# Patient Record
Sex: Female | Born: 1950 | Race: White | Hispanic: No | Smoking: Former smoker
Health system: Southern US, Community
[De-identification: ages and names within clinical notes are randomized; demographics above are authoritative.]

## PROBLEM LIST (undated history)

## (undated) DIAGNOSIS — R1310 Dysphagia, unspecified: Secondary | ICD-10-CM

## (undated) DIAGNOSIS — G8929 Other chronic pain: Secondary | ICD-10-CM

## (undated) DIAGNOSIS — J189 Pneumonia, unspecified organism: Secondary | ICD-10-CM

## (undated) DIAGNOSIS — M1990 Unspecified osteoarthritis, unspecified site: Secondary | ICD-10-CM

## (undated) DIAGNOSIS — F32A Depression, unspecified: Secondary | ICD-10-CM

## (undated) DIAGNOSIS — E119 Type 2 diabetes mellitus without complications: Secondary | ICD-10-CM

## (undated) DIAGNOSIS — K589 Irritable bowel syndrome without diarrhea: Secondary | ICD-10-CM

## (undated) DIAGNOSIS — T8859XA Other complications of anesthesia, initial encounter: Secondary | ICD-10-CM

## (undated) DIAGNOSIS — G4730 Sleep apnea, unspecified: Secondary | ICD-10-CM

## (undated) DIAGNOSIS — K760 Fatty (change of) liver, not elsewhere classified: Secondary | ICD-10-CM

## (undated) DIAGNOSIS — K219 Gastro-esophageal reflux disease without esophagitis: Secondary | ICD-10-CM

## (undated) DIAGNOSIS — E039 Hypothyroidism, unspecified: Secondary | ICD-10-CM

## (undated) DIAGNOSIS — I10 Essential (primary) hypertension: Secondary | ICD-10-CM

## (undated) DIAGNOSIS — F419 Anxiety disorder, unspecified: Secondary | ICD-10-CM

## (undated) DIAGNOSIS — E7800 Pure hypercholesterolemia, unspecified: Secondary | ICD-10-CM

## (undated) HISTORY — DX: Essential (primary) hypertension: I10

## (undated) HISTORY — PX: OTHER SURGICAL HISTORY: SHX169

## (undated) HISTORY — PX: PARTIAL HYSTERECTOMY: SHX80

## (undated) HISTORY — DX: Gastro-esophageal reflux disease without esophagitis: K21.9

## (undated) HISTORY — DX: Pure hypercholesterolemia, unspecified: E78.00

## (undated) HISTORY — DX: Type 2 diabetes mellitus without complications: E11.9

## (undated) HISTORY — PX: CHOLECYSTECTOMY: SHX55

## (undated) HISTORY — DX: Hypothyroidism, unspecified: E03.9

## (undated) HISTORY — DX: Dysphagia, unspecified: R13.10

---

## 2001-07-15 ENCOUNTER — Ambulatory Visit (HOSPITAL_COMMUNITY): Admission: RE | Admit: 2001-07-15 | Discharge: 2001-07-15 | Payer: Self-pay | Admitting: Internal Medicine

## 2003-03-15 ENCOUNTER — Ambulatory Visit (HOSPITAL_COMMUNITY): Admission: RE | Admit: 2003-03-15 | Discharge: 2003-03-15 | Payer: Self-pay | Admitting: Internal Medicine

## 2003-03-15 ENCOUNTER — Encounter (INDEPENDENT_AMBULATORY_CARE_PROVIDER_SITE_OTHER): Payer: Self-pay | Admitting: Internal Medicine

## 2003-12-11 ENCOUNTER — Ambulatory Visit (HOSPITAL_COMMUNITY): Admission: RE | Admit: 2003-12-11 | Discharge: 2003-12-11 | Payer: Self-pay | Admitting: Family Medicine

## 2004-05-27 ENCOUNTER — Ambulatory Visit: Payer: Self-pay | Admitting: Urgent Care

## 2007-03-21 ENCOUNTER — Ambulatory Visit (HOSPITAL_COMMUNITY): Admission: RE | Admit: 2007-03-21 | Discharge: 2007-03-21 | Payer: Self-pay | Admitting: *Deleted

## 2007-03-23 ENCOUNTER — Ambulatory Visit (HOSPITAL_COMMUNITY): Admission: RE | Admit: 2007-03-23 | Discharge: 2007-03-23 | Payer: Self-pay | Admitting: *Deleted

## 2007-03-25 ENCOUNTER — Encounter (INDEPENDENT_AMBULATORY_CARE_PROVIDER_SITE_OTHER): Payer: Self-pay | Admitting: Interventional Radiology

## 2007-03-25 ENCOUNTER — Ambulatory Visit (HOSPITAL_COMMUNITY): Admission: RE | Admit: 2007-03-25 | Discharge: 2007-03-25 | Payer: Self-pay | Admitting: Interventional Radiology

## 2007-04-01 ENCOUNTER — Ambulatory Visit (HOSPITAL_COMMUNITY): Admission: RE | Admit: 2007-04-01 | Discharge: 2007-04-01 | Payer: Self-pay | Admitting: Interventional Radiology

## 2007-04-12 ENCOUNTER — Encounter: Admission: RE | Admit: 2007-04-12 | Discharge: 2007-04-12 | Payer: Self-pay | Admitting: Interventional Radiology

## 2007-05-02 ENCOUNTER — Encounter: Admission: RE | Admit: 2007-05-02 | Discharge: 2007-05-02 | Payer: Self-pay | Admitting: Interventional Radiology

## 2010-07-06 ENCOUNTER — Encounter: Payer: Self-pay | Admitting: Interventional Radiology

## 2010-07-06 ENCOUNTER — Encounter: Payer: Self-pay | Admitting: Family Medicine

## 2010-07-10 ENCOUNTER — Encounter
Admission: RE | Admit: 2010-07-10 | Discharge: 2010-07-10 | Payer: Self-pay | Source: Home / Self Care | Attending: Neurosurgery | Admitting: Neurosurgery

## 2010-08-21 ENCOUNTER — Other Ambulatory Visit: Payer: Self-pay | Admitting: Neurosurgery

## 2010-08-21 DIAGNOSIS — M47816 Spondylosis without myelopathy or radiculopathy, lumbar region: Secondary | ICD-10-CM

## 2010-08-26 ENCOUNTER — Ambulatory Visit
Admission: RE | Admit: 2010-08-26 | Discharge: 2010-08-26 | Disposition: A | Discharge status: Home / Self Care | Payer: 59 | Source: Ambulatory Visit | Attending: Neurosurgery | Admitting: Neurosurgery

## 2010-08-26 ENCOUNTER — Other Ambulatory Visit: Payer: Self-pay | Admitting: Neurosurgery

## 2010-08-26 DIAGNOSIS — M47816 Spondylosis without myelopathy or radiculopathy, lumbar region: Secondary | ICD-10-CM

## 2010-10-28 ENCOUNTER — Ambulatory Visit (INDEPENDENT_AMBULATORY_CARE_PROVIDER_SITE_OTHER): Payer: 59 | Admitting: Internal Medicine

## 2010-10-28 DIAGNOSIS — K219 Gastro-esophageal reflux disease without esophagitis: Secondary | ICD-10-CM

## 2010-10-28 DIAGNOSIS — R1310 Dysphagia, unspecified: Secondary | ICD-10-CM

## 2010-10-31 NOTE — Op Note (Signed)
[facility]  Patient:    Erika Ward, Erika Ward Visit Number: [id number] MRN: [id number]          Service Type: END Location: DAY Attending Physician:  Malissa Hippo Dictated by:   Tana Coast, P.A. Proc. Date: 07/15/01 Admit Date:  07/15/2001 Discharge Date: 07/15/2001   CC:         Dr. Marta Antu   Operative Report  DATE OF BIRTH:  22-Aug-1950.  PROCEDURE: PROCEDURE:  Esophageal manometry.  REFERRING PHYSICIAN:  Dr. Marta Antu.  INDICATIONS:  A 60 year old Caucasian female with refractory gastroesophageal reflux symptoms.  On recent EGD she had a large sliding hiatal hernia with some inflammation at the level of the diaphragmatic hiatus felt to be due to gastroesophageal reflux disease.  As she had failed medical therapy, it was felt that she should consider laparoscopic Nissen fundoplication procedure. PRIOR STUDIES:  EGD on 07/14/01 by Dr. Karilyn Cota revealed findings mentioned above.  METHOD:  The esophageal manometry study was performed with [employer] Gastrosoft Upper Gastrointestinal Polygram, version 6.40, using an Arndorfer infusion system at 0.5 cc/min. The lower esophageal sphincter (LES) was identified in four ports with the standard station pullthrough technique. For esophageal body pressures, the tip of the catheter was placed 3 cm above the proximal aspect of the LES.  Ten wet swallows were taken with ports 5 cm apart in the esophageal body.  This procedure was reviewed with the patient prior to performing it.  FINDINGS:  Esophageal body:  13 cm     8 cm      3 cm       Mean (distal 2 ports) Amplitude (mmHg)  86.0      96.1      67.3       81.7                   (38-102)  (49-131)  (64-154)   (59-139)  Duration (sec)    4.2       7.6       8.6        8.1                   (3-5)     (3-5)     (2.5-3.5)   (3.5)  Contractions (with wet swallows):  10 of 11 were peristaltic with one simultaneous.  Lower esophageal sphincter  (LES):  Located at 35-31 cm (from nares). Resting pressure: 10.0 mmHg. Relaxation:  Difficult to assess based on tracing.  IMPRESSION:  Nonspecific motility disorder with abnormal body and low resting LES pressure.  Upper leads were located in the upper esophageal sphincter making tracing poor tracing but there was good peristalsis.  There was high amplitude in the distal leads.  LES pressure was low making relaxation difficult to determine.  There was no manometric contraindications the antireflux surgery.  RECOMMENDATIONS:  Referral to Dr. Marta Antu for consideration of laparoscopic Nissen fundoplication.  Dictated by:   Tana Coast, P.A. Attending Physician:  Malissa Hippo DD:  07/21/01 TD:  07/21/01 Job: [id number]

## 2010-10-31 NOTE — H&P (Signed)
Erika Ward, Erika Ward               ACCOUNT NO.:  [id number]   MEDICAL RECORD NO.:  [id number]          PATIENT TYPE:  OUT   LOCATION:  RAD                           FACILITY:  [facility]   PHYSICIAN:  Lionel December, M.D.    DATE OF BIRTH:  [birth year]   DATE OF ADMISSION:  DATE OF DISCHARGE:  LH                                HISTORY & PHYSICAL   CURRENT MEDICATIONS:  1.  Nexium 40 mg daily.  2.  Cymbalta 30 mg b.i.d.  3.  Levoxyl 0.1 mg daily.  4.  Methadone 10 mg t.i.d.  5.  Clonazepam 0.5 mg t.i.d.  6.  Carafate p.r.n.  7.  Maalox p.r.n.  8.  Aleve p.r.n.   ALLERGIES:  REGLAN CAUSES NERVOUSNESS.   FAMILY HISTORY:  Mother deceased at age 37 secondary to MI.  Father deceased  at age 6 secondary to pharyngeal carcinoma.  She has one brother deceased  secondary to lung carcinoma who was a smoker.  Two brothers alive with  hypertension and diabetes mellitus.  Two sisters alive with diabetes  mellitus and hypertension.   SOCIAL HISTORY:  Ms. Cai is currently divorced.  She lives with her son.  She has three grown healthy children.  She is currently unemployed.  She  reports infrequent tobacco use in the past quitting three months ago, denies  any drug use.   REVIEW OF SYSTEMS:  CONSTITUTIONAL:  Weight is stable.  Denies any anorexia.  Denies any fever or chills.  CARDIOVASCULAR:  Denies any history of chest  pain or palpitations.  PULMONARY:  She does have occasional shortness of  breath on exertion.  Denies any dyspnea, cough or hemoptysis.  GI:  See HPI.   PHYSICAL EXAMINATION:  VITAL SIGNS:  Weight 109.5 pounds, height 61 inches,  temperature 96.8, blood pressure 136/86, pulse 72.  GENERAL APPEARANCE:  Erika Ward is a 60 year old Caucasian female who was  alert, oriented, pleasant, but appears very anxious on exam today.  HEENT:  Sclerae are clear, nonicteric.  Conjunctivae are pink.  Oropharynx  pink and moist without any lesions.  NECK:  Supple without any mass or  thyromegaly.  CHEST:  Heart rate regular rate and rhythm with normal S1, S2 without any  murmurs, clicks, rubs or gallops.  LUNGS:  Clear to auscultation bilaterally.  ABDOMEN:  Positive bowel sounds x4.  No bruits auscultated.  Soft.  She does  have significant grimacing during very light palpation.  No discrete masses  or hepatosplenomegaly.  She does have large midline, well-healed, vertical  scar from previous surgeries.  No rebound tenderness or guarding.  EXTREMITIES:  Pedal pulses 2+.  No edema.  SKIN:  Pink, warm and dry without any rash or jaundice.   ASSESSMENT:  Erika Ward is a 60 year old Caucasian female with extensive GI  history including previous Nissen fundoplication duodenal perforation at the  time of ERCP with biliary and pancreatic sphincterotomy for sphincter  __________ dysfunction about a year ago at [facility], followed by subsequent  Roux-en-Y procedure by Dr. Gabriel Cirri in December 2004 for regurgitation of  bowel.  She has  had negative CT scan and CTA within the last year to account  for her pain.  At this time, she appears to have symptoms of erosive reflux  esophagitis.  Would recommend further evaluation with EGD.   RECOMMENDATIONS:  1.  Nexium 40 mg #5 boxes were given.  2.  Will schedule EGD with Dr. Karilyn Cota in the very near future.  3.  Discussed the procedure including risks and benefits which include but      are not limited to bleeding, infection, perforation, drug reaction      __________.  Consent will be obtained.  4.  She is to decrease the amount of Aleve that she is taking, and she is      definitely at risk for a peptic ulcer disease.  5.  Further recommendations pending procedure.     Kand   KC/MEDQ  D:  05/28/2004  T:  05/28/2004  Job:  [id number]   cc:   Donzetta Sprung  [address], [address]  [place]  [place] [address]  Fax: [phone]

## 2010-10-31 NOTE — Op Note (Signed)
NAME:  Erika Ward, Erika Ward                         ACCOUNT NO.:  [id number]   MEDICAL RECORD NO.:  [id number]                   PATIENT TYPE:  AMB   LOCATION:  DAY                                  FACILITY:  [facility]   PHYSICIAN:  Lionel December, M.D.                 DATE OF BIRTH:  [birth year]   DATE OF PROCEDURE:  03/15/2003  DATE OF DISCHARGE:                                 OPERATIVE REPORT   PROCEDURE:  Esophagogastroduodenoscopy.   ENDOSCOPIST:  Lionel December, M.D.   INDICATIONS:  Erika Ward is a 60 year old Caucasian female with intractable pain  in the epigastric and right upper quadrant.  She was hospitalized last  month. She had elevated transaminases and alkaline phosphatase.  Ultrasound  and MRCP were negative for choledocholithiasis.  She has had prior  cholecystectomy.  Her transaminases are normal and AP is also almost normal;  however, she continues to have pain.  She has not responded to PPI.  She is  undergoing diagnostic EGD prior to considering biliary manometry.  The  procedure and risks were reviewed with the patient and informed consent was  obtained.   PREOPERATIVE MEDICATIONS:  Cetacaine spray for oropharyngeal topical  anesthesia, Demerol 50 mg IV and Versed 12 mg IV in divided dose.   FINDINGS:  Procedure performed in endoscopy suite.  The patient's vital  signs and O2 saturation were monitored during the procedure and remained  stable.  The patient was placed in the left lateral recumbent position and  Olympus videoscope was passed via the oropharynx without any difficulty into  the esophagus.   ESOPHAGUS:  Mucosa of the esophagus was normal.  Squamocolumnar junction was  located at 31 cm from the incisors.  There was a 3 cm long pouch of gastric  mucosa.  The wrap was felt to be at this level.  It was felt that the wrap  had displaced distally.  There was no stricture.  There was no ulceration  involving this part of the stomach.   STOMACH:  It was empty and  distended very well with insufflation.  The folds  of the proximal stomach were normal.  Examination of the mucosa reveals  linear streaks of erythematous mucosa at the antrum converging onto the  pylorus. However, there were no erosions or ulcers noted. The pyloric  channel was patent.  The scope was retroflexed and the wrap was noted to be  very short and loose.   DUODENUM:  Examination of the bulb reveals normal mucosa.  Mucosa and folds  of the second part of the duodenum were normal.   The endoscope was withdrawn.  The patient tolerated the procedure well.   FINAL DIAGNOSES:  1. No evidence of peptic ulcer disease.  2. Antral gastritis.  3. She has a small hiatal hernia or a gastric pouch suggestive of distal     displacement of fundal wrap which is very loose.  RECOMMENDATIONS:  1. Upper gastrointestinal series today to further assess the position of     this wrap.  2. Bentyl 10 mg p.o. t.i.d. Will make plans for her to go to [facility] for     possible ERCP with biliary manometry unless she responds dramatically to     antispasmodic.      ___________________________________________                                            Lionel December, M.D.   NR/MEDQ  D:  03/15/2003  T:  03/16/2003  Job:  [id number]   cc:   Donzetta Sprung  [address], [address]  [place]  [place] [address]  Fax: [phone]

## 2010-10-31 NOTE — H&P (Signed)
Erika Ward, Erika Ward               ACCOUNT NO.:  [id number]   MEDICAL RECORD NO.:  [id number]           PATIENT TYPE:   LOCATION:                                FACILITY:  [facility]   PHYSICIAN:  Lionel December, M.D.    DATE OF BIRTH:  1951/06/01   DATE OF ADMISSION:  DATE OF DISCHARGE:  LH                                HISTORY & PHYSICAL   REASON FOR ADMISSION:  Chronic gastroesophageal reflux disease, epigastric  pain.   PRIMARY CARE PHYSICIAN:  Dr. Donzetta Sprung   HISTORY OF PRESENT ILLNESS:  [name] is a 60 year old Caucasian female  who continues to complain of epigastric pain and chronic GERD symptoms.  She  notes burning along her esophagus. She also complains of constant  epigastric soreness. She has tried Carafate with minimal relief. She is also  on Nexium b.i.d.  She has tried Maalox as well. She is also taking Aleve 4  tablets daily with minimal help. She has been placed on Methadone 10 mg  t.i.d. by Dr. Reuel Boom although she does not report complete resolution of her  symptoms. She denies any dysphagia or odynophagia. She does report chronic  nausea with occasional emesis. She underwent a CT scan of the abdomen and  pelvis which was normal. On January 17, 2004 she had a CT angiogram which did  not show any significant disease. There was mild atheromatous plaque  involving the aorta but no abnormality noted at the mesentery  tree.  Bowel  movements have been soft and brown. She denies any rectal bleeding or  melena. She does not laryngitis. She denies any anorexia.   PAST MEDICAL HISTORY:  1.  Diabetes mellitus.  2.  Chronic GERD, last colonoscopy by Dr. Erskine Speed within the last 5      years reportedly was normal.  3.  Large hiatal hernia status post laparoscopic Nissen fundoplication by      Dr. Gabriel Cirri in February of 2003.  4.  Open cholecystectomy in 1989 for biliary dyskinesia.  5.  Hysterectomy in 1998.  6.  Hypothyroidism.  7.  Irritable bowel syndrome.  8.   Hyperlipidemia.  9.  History of sphincter of Oddi dysfunction. She had endoscopic retrograde      cholangiopancreatography with a sphincterotomy on April 03, 2003;      postoperatively developed pancreatitis and was admitted to the [facility].  She ultimately had a duodenal tear and underwent      exploratory laparotomy with repair to the duodenal tear with exclusion      of the duodenum by suturing the pyloris shut, and gastro-  jejunostomy.  1.  In December of 2004 she had a roux-en-Y to prevent regurgitation of      bile.   Dictation ended here     Kand   KC/MEDQ  D:  05/28/2004  T:  05/28/2004  Job:  [id number]

## 2010-10-31 NOTE — Consult Note (Signed)
NAME:  Erika Ward, CHITTUM                     ACCOUNT NO.:  [id number]   MEDICAL RECORD NO.:  [id number]                  PATIENT TYPE:   LOCATION:                                       FACILITY:  [facility]   PHYSICIAN:  Lionel December, M.D.                 DATE OF BIRTH:  Feb 16, 1951   DATE OF CONSULTATION:  03/08/2003  DATE OF DISCHARGE:                                   CONSULTATION   GASTROENTEROLOGY CONSULTATION:   REFERRING PHYSICIAN:  Dr. Donzetta Sprung   REASON FOR CONSULTATION:  Upper abdominal pain centered around right upper  quadrant.   HISTORY OF PRESENT ILLNESS:  Erika Ward is a 60 year old Caucasian female who is  referred at the courtesy of Dr. Donzetta Sprung for GI evaluation.  She  presents with 35-month history of pain across her upper abdomen but more so  in the right upper quadrant.  Pain is described as pins and needles at times  intractable.  She has nausea.  She has not had any vomiting.  Back first  week of August she had lower chest pain.  She was hospitalized.  Myocardial  infarct was ruled out.  At that time she was noted to have mildly elevated  AP of 145, AST was 177, ALT was 164.  Therefore, biliary colic was  suspected.  However, ultrasound was negative for cholelithiasis.  She  subsequently had MRCP on an outpatient basis on February 13, 2003.  It  revealed mildly dilated common hepatic duct measuring 8 mm however, there  was no stricture or stone.  In the meantime, her transaminases have been  coming down.  On January 23, 2003, her AST was 25 and ALT was 30 and AP was  151.  On February 07, 2003, her AP was 153, AST was 22, and ALT was 17.  She  has been using Pepcid, Rolaids, Tums, and Pepto-Bismol but without any  relief of pain.  She is not sure if meals make pain worse.  She has been  having intermittent hoarseness but she denies heartburn.  She has lost 5  pounds in the last few weeks.  She has noted black stools but this was felt  to be due to Pepto-Bismol  that she has been taking periodically.  She also  has history of constipation and has been using Metamucil capsules two every  other day and also chews one or two Fiber Choice pills.  She has not  experienced any fever or chills.  She had an episode of severe pain last  night.  She is presently on Synthroid 75 mcg once daily, Librax one b.i.d.,  Singulair 10 mg once daily.  P.r.n. medications include Mylanta, Fiber  Choice, Rolaids, Tums, and Pepcid.   PAST MEDICAL HISTORY:  She has chronic GERD.  She was found to have a large  hiatal hernia.  She underwent laparoscopic Nissen fundoplication by Dr.  __________ in February 2003.  For several weeks, she had dysphagia but this  gradually resolved.  She has history of IBS.  She typically has pain in her  left lower quadrant with her IBS.  History of hyperlipidemia.  She was on  Lipitor which was discontinued when she was in the hospital last month with  elevated transaminases.  She had open cholecystectomy in 1989 for biliary  dyskinesia and she has had hysterectomy in 1988 and Nissen fundoplication in  February 2003 as above.  She also has hypothyroidism.  Her last EGD was  prior to her lap Nissen in January 2003 revealing large sliding hiatal  hernia.   ALLERGIES:  REGLAN, which makes her very nervous.   FAMILY HISTORY:  Mother had emphysema and died of MI at age 54.  Father died  of throat carcinoma at age 65.  She lost one sister of MI at age 54, another  sister has had problems with bad GERD.  She has three brothers.   SOCIAL HISTORY:  She is divorced.  She has three grownup children.  She  works at [employer]  She smoked off and on for 10 years no more than half a pack  per day, at this time she has not smoked any cigarettes over a year ago.  She drinks alcohol very occasionally.   PHYSICAL EXAMINATION:  GENERAL:  Well-developed, well-nourished Caucasian  female who is in no acute distress.  VITAL SIGNS:  She weighs 116 pounds, she is 5  feet 1 inch tall, pulse 76 per  minute, blood pressure 112/72.  She is afebrile.  HEENT:  Conjunctivae are pink.  Sclerae are nonicteric.  Oropharyngeal  mucosa is normal.  NECK:  Without masses or thyromegaly.  CARDIAC:  Regular rhythm, normal S1, S3.  No murmur or gallop noted.  LUNGS:  Clear to auscultation.  ABDOMEN:  Flat, bowel sounds are normal, palpation reveals mild to moderate  tenderness epigastrium and below RCM.  Most tender area is in epigastrium to  the right of midline.  No hepatosplenomegaly.  RECTAL:  Exam deferred.  EXTREMITIES:  She does not have clubbing or peripheral edema.   ASSESSMENT:  Erika Ward is a 60 year old Caucasian female with 11-month history of  upper abdominal pain which is described to be intractable at times.  She was  hospitalized last month for what was felt to be chest pain and noted to have  elevated transaminases however, ultrasound and MRCP were negative for  cholelithiasis or other abnormalities.  Her common hepatic duct was  estimated to be 8 mm which would be considered normal in a person who has  had cholecystectomy.  Her transaminases have normalized but alkaline  phosphatase is borderline.  However, there has been no relief of pain.  It  is possible that we are dealing with peptic ulcer disease however, she could  also have sphincter of Oddi dysfunction.  It would be worthwhile to check  her LFTs again.  If transaminases are elevated I feel she will need ERCP  with sphincterotomy for what would be considered to be type 2 sphincter of  Oddi dysfunction.  However, if transaminases are normal we may have to rule  out peptic ulcer disease.   RECOMMENDATIONS:  She will go to [facility] today or tomorrow for LFTs.  We  will start her on Aciphex 20 mg p.o. q.a.m.   If LFTs are normal we will consider EGD prior to sending the patient for  bilirubinometry.  However, if there is another spike in  her transaminases I feel she would benefit from ERCP with  endoscopic sphincterotomy.   I would like to thank Dr. Donzetta Sprung for opportunity to participate in the  care of this nice lady.      ___________________________________________                                            Lionel December, M.D.   NR/MEDQ  D:  03/08/2003  T:  03/09/2003  Job:  [id number]   cc:   Donzetta Sprung  [address], [address]  [place]  [place] [address]  Fax: [phone]   [facility]

## 2010-11-03 NOTE — Consult Note (Signed)
NAMEWESLEIGH, MARKOVIC               ACCOUNT NO.:  [id number]  MEDICAL RECORD NO.:  [id number]          PATIENT TYPE:  LOCATION:                                 FACILITY:  PHYSICIAN:  Lionel December, M.D.    DATE OF BIRTH:  09-16-1950  DATE OF CONSULTATION:  10/28/2010 DATE OF DISCHARGE:                                CONSULTATION   REASON FOR CONSULTATION:  Dysphagia, GERD.  HISTORY OF PRESENT ILLNESS:  [name] is a 60 year old female, referred to our office by Dr. Donzetta Sprung.  She complains today of epigastric pain.  She also states she has nausea all the time.  She does complain of a sore throat.  She says she has had symptoms for a couple of months.  She also complains that foods are feel like they are lodging in the middle of her chest.  This dysphagia has been going on for approximately 6 months.  She says that chicken and cereal will choke her.  Hamburger, peanuts, and cookies will also feel like they are lodging.  She does complain of frequent acid reflux and the Nexium for the most part will control it but not completely.  She says that she has had acid reflux that will come up into her nose and mouth at night, but this has not occurred in a couple of months.  She says in the past, she has been told that she aspirated and was treated for pneumonia and for acid reflux.  She usually has 1 bowel movement a day and is normal.  In 2004, she underwent an EGD for intractable epigastric and right upper quadrant plain.  It was noted she had elevated transaminases and ALP. The MRCP was negative for cholelithiasis.  She has had a prior cholecystectomy.  The EGD revealed no evidence of peptic ulcer disease, antral gastritis.  She has had a small hiatal hernia or gastric pouch, suggestive of distal displacement of the fundal wrap which is loose.  In 2005, she had a Nissen fundoplication duodenal perforation at the time of her ERCP with biliary and pancreatic sphincterotomy for  sphincter of Oddi dysfunction which was in 2004 it appears.  She is allergic to [facility] and ZOFRAN.  SURGERIES:  She has had a partial hysterectomy and her left ovary was not removed.  She has had a cholecystectomy.  She says she has had a repair of a puncture to her small bowel at [facility].  After a procedure, she states which she really could not explain, she has had right elbow surgery.  She has had Nissen wrap which has been undone now.  MEDICAL HISTORY:  Includes IBS, hypothyroidism, hypertension, high cholesterol, borderline diabetic type 2, ulcers, depression.  She does have a history of pancreatic and biliary stent.  FAMILY HISTORY:  Her mother is deceased from COPD, MI.  Father is deceased from lung cancer.  Three sisters, one is deceased from CHF, two are alive, both have had MIs, one has COPD.  Two brothers, one has had an MI and has diabetes and hypertension, one has diabetes type 2 and hypertension.  SOCIAL HISTORY:  She is divorced.  She is disabled.  She does not smoke, drink, or do drugs.  She quit 6 years ago after smoking for 12 and she has 3 children in good health.  HOME MEDICATIONS: 1. Synthroid 88 mcg a day. 2. Flonase nasal p.r.n. 3. Cymbalta 60 mg a day. 4. Xanax 1 mg a day. 5. Amitiza 24 mcg b.i.d. 6. Crestor 30 mg a day. 7. Prempro 0.625 mg daily. 8. Methadone 5 mg 3 times a day. 9. Nexium 40 mg 1 a day. 10.Losartan/HCT 1 daily. 11.Phenergan 25 mg daily. 12.BC Powders 3-4 a week. 13.Allergy relief p.r.n.  OBJECTIVE:  VITAL SIGNS:  Her weight is 147, her height is 5 feet 1 inches, her temperature is 98.5, her blood pressure is 102/72, her pulse is 80. HEENT:  She has natural teeth.  Her oral mucosa is moist.  There is no lesions.  Her conjunctivae are pink.  Her sclerae are anicteric. NECK:  Her thyroid is normal.  There is no cervical lymphadenopathy. LUNGS:  Clear. HEART:  Regular rate and rhythm. ABDOMEN:  Soft.  Bowel  sounds are positive.  She does have epigastric tenderness.  ASSESSMENT:  Erika Ward is a 60 year old female presenting today with epigastric pain.  Peptic ulcer disease needs to be ruled out.  She is also complaining of some dysphagia to solids.  RECOMMENDATIONS:  We will schedule an EGD/ED with Dr. Karilyn Cota in the near future.  The risk and benefits were reviewed with the patient and she is agreeable.  She will elevate the head of her bed.  She will also stop the [facility] powders which could cause peptic ulcer disease.    ______________________________ Dorene Ar, NP   ______________________________ Lionel December, M.D.    TS/MEDQ  D:  10/28/2010  T:  10/29/2010  Job:  [id number]  cc:   Donzetta Sprung Fax: [phone]  Electronically Signed by Dorene Ar PA on 10/30/2010 04:23:43 PM Electronically Signed by Lionel December M.D. on 11/03/2010 09:43:02 AM

## 2010-12-11 ENCOUNTER — Ambulatory Visit (HOSPITAL_COMMUNITY)
Admission: RE | Admit: 2010-12-11 | Discharge: 2010-12-11 | Disposition: A | Discharge status: Home / Self Care | Payer: PRIVATE HEALTH INSURANCE | Source: Ambulatory Visit | Attending: Internal Medicine | Admitting: Internal Medicine

## 2010-12-11 ENCOUNTER — Encounter (HOSPITAL_BASED_OUTPATIENT_CLINIC_OR_DEPARTMENT_OTHER): Payer: 59 | Admitting: Internal Medicine

## 2010-12-11 DIAGNOSIS — K222 Esophageal obstruction: Secondary | ICD-10-CM | POA: Insufficient documentation

## 2010-12-11 DIAGNOSIS — E119 Type 2 diabetes mellitus without complications: Secondary | ICD-10-CM | POA: Insufficient documentation

## 2010-12-11 DIAGNOSIS — K219 Gastro-esophageal reflux disease without esophagitis: Secondary | ICD-10-CM

## 2010-12-11 DIAGNOSIS — E785 Hyperlipidemia, unspecified: Secondary | ICD-10-CM | POA: Insufficient documentation

## 2010-12-11 DIAGNOSIS — Z79899 Other long term (current) drug therapy: Secondary | ICD-10-CM | POA: Insufficient documentation

## 2010-12-11 DIAGNOSIS — R1013 Epigastric pain: Secondary | ICD-10-CM | POA: Insufficient documentation

## 2010-12-11 DIAGNOSIS — K449 Diaphragmatic hernia without obstruction or gangrene: Secondary | ICD-10-CM

## 2010-12-11 DIAGNOSIS — R1310 Dysphagia, unspecified: Secondary | ICD-10-CM

## 2010-12-11 DIAGNOSIS — I10 Essential (primary) hypertension: Secondary | ICD-10-CM | POA: Insufficient documentation

## 2010-12-11 LAB — GLUCOSE, CAPILLARY: Glucose-Capillary: 112 mg/dL — ABNORMAL HIGH (ref 70–99)

## 2010-12-23 NOTE — Op Note (Signed)
Erika Ward, Erika Ward               ACCOUNT NO.:  [id number]  MEDICAL RECORD NO.:  [id number]  LOCATION:  DAYP                          FACILITY:  [facility]  PHYSICIAN:  Lionel December, M.D.    DATE OF BIRTH:  10-29-1950  DATE OF PROCEDURE:  12/11/2010 DATE OF DISCHARGE:                              OPERATIVE REPORT   PROCEDURE:  Esophagogastroduodenoscopy with esophageal dilation with a balloon.  INDICATIONS:  Kaede is a 60 year old Caucasian female with very complicated GI history who presents with solid food dysphagia as well as epigastric pain.  She also has a history of peptic ulcer disease.  She is taking BC powder at least 3-4 times a day.  She has chronic abdominal pain and maintained on methadone.  She is also on PPI chronically.  The patient's last EGD ED was at the [facility] in May 2010 with [facility] system.  Procedure risks were reviewed with the patient and informed consent was obtained.  MEDICATIONS FOR CONSCIOUS SEDATION:  Cetacaine spray for pharyngeal topical anesthesia, Demerol 50 mg IV, Versed 10 mg IV, promethazine 25 mg IV in diluted form.  FINDINGS:  Procedure performed in endoscopy suite.  The patient's vital signs and O2 saturations were monitored during the procedure and remained stable.  The patient was placed in left lateral recumbent position and Pentax videoscope was passed via oropharynx without any difficulty into esophagus.  Short, but very tortuous esophagus.  GE junction was located at 27 cm from the incisors with thick white mucosa ring.  Mucosa of the esophagus, however, was normal.  Hiatus was wide open, located at 31 cm. Mucosa of herniated part of stomach was normal.  Stomach:  It was empty and distended very well with insufflation.  Folds of proximal stomach are normal.  Examination of mucosa revealed 2 antral scars, but no erosions or ulcers were noted.  Pyloric channel was patent.  Scope was retroflexed to examine angularis, fundus, and  cardia. Hernia was easily seen on this view.  Duodenum:  Bulbar mucosa was normal.  Scope was passed into second part of duodenum where mucosa and folds were normal.  I elected to use balloon dilator to dilate the GE junction.  Balloon dilator was passed through the scope.  Guidewire was pushed in the gastric lumen.  Balloon dye was positioned across distal esophagus and initially insufflated to a diameter 15 mm followed by 16.5.  balloon was deflated.  There was no mucosal disruption noted.  Balloon was repositioned and at this time insufflated to a diameter of 18 mm and maintained for few seconds and then advanced distally.  This resulted very small linear tear just proximal to the GE junction.  Endoscope was withdrawn.  The patient tolerated the procedure well.  FINAL DIAGNOSES: 1. Short tortuous esophagus with the ring at gastroesophageal     junction.  It was dilated with a balloon from 15-18 mm. 2. Moderate-sized sliding hiatal hernia. 3. Antral scarring from previous peptic ulcer disease, but no evidence     of active disease. RECOMMENDATIONS: 1. The patient advised that she should not be using a BC powder or     other OTC NSAIDs. 2. She will resume her  other meds and call us with a progress report     in 1 week. 3. If she remains with dysphagia, we will obtain a barium pill     esophagogram before any other intervention undertaken.          ______________________________ Lionel December, M.D.     NR/MEDQ  D:  12/11/2010  T:  12/11/2010  Job:  [id number]  cc:   Donzetta Sprung, MD Fax: [phone]  Electronically Signed by Lionel December M.D. on 12/23/2010 09:57:07 PM

## 2011-03-26 LAB — CBC
HCT: 36.4
Hemoglobin: 12.6
MCHC: 34.5
MCV: 88.6
Platelets: 252
RBC: 4.11
RDW: 12.6
WBC: 4.7

## 2011-03-26 LAB — PROTIME-INR
INR: 0.9
Prothrombin Time: 12.8

## 2011-03-26 LAB — CREATININE, SERUM
Creatinine, Ser: 0.67
GFR calc Af Amer: >60
GFR calc non Af Amer: >60

## 2011-03-26 LAB — BUN: BUN: 11

## 2011-08-20 ENCOUNTER — Ambulatory Visit (INDEPENDENT_AMBULATORY_CARE_PROVIDER_SITE_OTHER): Payer: 59 | Admitting: Internal Medicine

## 2011-09-03 ENCOUNTER — Ambulatory Visit (INDEPENDENT_AMBULATORY_CARE_PROVIDER_SITE_OTHER): Payer: 59 | Admitting: Internal Medicine

## 2012-04-06 ENCOUNTER — Telehealth (INDEPENDENT_AMBULATORY_CARE_PROVIDER_SITE_OTHER): Payer: Self-pay | Admitting: *Deleted

## 2012-04-06 NOTE — Telephone Encounter (Signed)
Erika Ward called and says that she has an appointment with Dr.Rehman on 05/30/12. She is having a really bad time with the acid reflux, Epigastric Pain. She is requesting to move her appointment up as she feels that she cannot wait until December. She says that she will see [name]. An appointment was given for 10--28-13 @ 2 pm with [name]. She was ask to bring her prescription medications, otc medications and any vitamins that she may be taking as well as her insurance information.

## 2012-04-11 ENCOUNTER — Ambulatory Visit (INDEPENDENT_AMBULATORY_CARE_PROVIDER_SITE_OTHER): Payer: Medicare Other | Admitting: Internal Medicine

## 2012-04-11 ENCOUNTER — Encounter (INDEPENDENT_AMBULATORY_CARE_PROVIDER_SITE_OTHER): Payer: Self-pay | Admitting: Internal Medicine

## 2012-04-11 VITALS — BP 124/80 | HR 116 | Temp 98.1°F | Ht 61.0 in | Wt 136.8 lb

## 2012-04-11 DIAGNOSIS — E119 Type 2 diabetes mellitus without complications: Secondary | ICD-10-CM | POA: Insufficient documentation

## 2012-04-11 DIAGNOSIS — E039 Hypothyroidism, unspecified: Secondary | ICD-10-CM | POA: Insufficient documentation

## 2012-04-11 DIAGNOSIS — K219 Gastro-esophageal reflux disease without esophagitis: Secondary | ICD-10-CM

## 2012-04-11 DIAGNOSIS — E7800 Pure hypercholesterolemia, unspecified: Secondary | ICD-10-CM

## 2012-04-11 MED ORDER — SUCRALFATE 1 GM/10ML PO SUSP: 1.0000 g | Freq: Four times a day (QID) | ORAL | Status: DC

## 2012-04-11 NOTE — Patient Instructions (Addendum)
Carafate 4 times a day. Samples of Dexilant given to patient. Stop the [facility] powders. And the ZNexium

## 2012-04-11 NOTE — Progress Notes (Signed)
Subjective:     Patient ID: Erika Ward, female   DOB: Oct 04, 1950, 61 y.o.   MRN: [id number]  HPIPresents today with c/o that her acid reflux is terrible. She has epigastric pain.  She has a lot of nausea. Symptoms  X 3 months. Progressively worsened. She tells me she is taking BC Powder about 6 a week. Appetite is good. No weight loss. When she eats she hurts in her chest and when she swallows.  She does not think foods ar lodging.  She usually has a BM about 1 a day and sometimes she may go 3 days with a BM. She also c/o hoarseness. EGD/ED 12/11/2010 Dysphagia and Epigastric pain:  1. 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 It was dilated with a balloon from 15-18 mm.  2. Moderate-sized sliding hiatal hernia.  3. Antral scarring from previous peptic ulcer disease, but no evidence  of active disease.  Review of Systems see hpi Current Outpatient Prescriptions  Medication Sig Dispense Refill  . ALPRAZolam (XANAX) 1 MG tablet Take 1 mg by mouth 3 (three) times daily as needed.      . Aspirin-Salicylamide-Caffeine (BC HEADACHE POWDER PO) Take by mouth.      . cyclobenzaprine (FLEXERIL) 10 MG tablet Take 10 mg by mouth 3 (three) times daily as needed.      . diphenhydrAMINE (BENADRYL) 25 MG tablet Take 25 mg by mouth every 6 (six) hours as needed.      Marland Kitchen esomeprazole (NEXIUM) 40 MG capsule Take 40 mg by mouth 3 (three) times daily.      Marland Kitchen lactobacillus acidophilus (BACID) TABS Take 2 tablets by mouth 3 (three) times daily.      Marland Kitchen levothyroxine (SYNTHROID, LEVOTHROID) 88 MCG tablet Take 88 mcg by mouth daily.      Marland Kitchen losartan-hydrochlorothiazide (HYZAAR) 100-12.5 MG per tablet Take 1 tablet by mouth daily.      Marland Kitchen lubiprostone (AMITIZA) 24 MCG capsule Take 24 mcg by mouth 2 (two) times daily with a meal.      . metFORMIN (GLUMETZA) 500 MG (MOD) 24 hr tablet Take 500 mg by mouth daily with breakfast.      . methadone (DOLOPHINE) 5 MG tablet Take 5 mg by mouth  every 8 (eight) hours as needed.      . polyethylene glycol (MIRALAX / GLYCOLAX) packet Take 17 g by mouth daily.      . promethazine (PHENERGAN) 25 MG tablet Take 25 mg by mouth every 6 (six) hours as needed.       Past Medical History  Diagnosis Date  . Dysphagia   . GERD (gastroesophageal reflux disease)   . Diabetes     Type 2 x 1 yr  . Hypertension     1 yr  . Hypothyroid    Past Surgical History  Procedure Date  . Partial hysterectomy   . Cholecystectomy   . Repair of small bowel at [facility]   . [employer] wrap    History   Social History  . Marital Status: Divorced    Spouse Name: N/A    Number of Children: N/A  . Years of Education: N/A   Occupational History  . Not on file.   Social History Main Topics  . Smoking status: Never Smoker   . Smokeless tobacco: Not on file  . Alcohol Use: No  . Drug Use: No  . Sexually Active: Not on file   Other Topics Concern  . Not  on file   Social History Narrative  . No narrative on file   Family Status  Relation Status Death Age  . Mother Deceased     copd  . Father Deceased     lung cancer  . Sister Alive     One deceased from CHF, 2 sister with MIs and one has COPD  . Brother Alive     One has had an MI and has DM and HTN, one has DM2 and hypertension   Allergies  Allergen Reactions  . Reglan (Metoclopramide)   . Zofran (Ondansetron Hcl)         Objective:   Physical Exam Filed Vitals:   04/11/12 1423  BP: 124/80  Pulse: 116  Temp: 98.1 F (36.7 C)  Height: 5\' 1"  (1.549 m)  Weight: 136 lb 12.8 oz (62.052 kg)        Assessment:    Uncontrolled GERD at this time.     Plan:    Samples of Dexilant given to patient.  Stop the [facility] Powder.

## 2012-05-02 ENCOUNTER — Telehealth (INDEPENDENT_AMBULATORY_CARE_PROVIDER_SITE_OTHER): Payer: Self-pay | Admitting: Internal Medicine

## 2012-05-02 MED ORDER — DEXLANSOPRAZOLE 60 MG PO CPDR: 60.0000 mg | DELAYED_RELEASE_CAPSULE | Freq: Every day | ORAL | Status: DC

## 2012-05-02 NOTE — Telephone Encounter (Signed)
Rx for Dexilant eprescribed to her pharmacy 

## 2012-05-09 ENCOUNTER — Ambulatory Visit (INDEPENDENT_AMBULATORY_CARE_PROVIDER_SITE_OTHER): Payer: Medicare Other | Admitting: Internal Medicine

## 2012-05-16 ENCOUNTER — Other Ambulatory Visit (INDEPENDENT_AMBULATORY_CARE_PROVIDER_SITE_OTHER): Payer: Self-pay | Admitting: *Deleted

## 2012-05-16 ENCOUNTER — Encounter (INDEPENDENT_AMBULATORY_CARE_PROVIDER_SITE_OTHER): Payer: Self-pay | Admitting: *Deleted

## 2012-05-16 ENCOUNTER — Ambulatory Visit (INDEPENDENT_AMBULATORY_CARE_PROVIDER_SITE_OTHER): Payer: Medicare Other | Admitting: Internal Medicine

## 2012-05-16 ENCOUNTER — Encounter (INDEPENDENT_AMBULATORY_CARE_PROVIDER_SITE_OTHER): Payer: Self-pay | Admitting: Internal Medicine

## 2012-05-16 VITALS — BP 144/100 | HR 108 | Temp 98.3°F | Ht 61.0 in | Wt 136.9 lb

## 2012-05-16 DIAGNOSIS — R1310 Dysphagia, unspecified: Secondary | ICD-10-CM

## 2012-05-16 DIAGNOSIS — K219 Gastro-esophageal reflux disease without esophagitis: Secondary | ICD-10-CM

## 2012-05-16 NOTE — Patient Instructions (Addendum)
EGD/ED with DR. Rehman. The risks and benefits such as perforation, bleeding, and infection were reviewed with the patient and is agreeable.

## 2012-05-16 NOTE — Progress Notes (Signed)
Subjective:     Patient ID: Erika Ward, female   DOB: 1951/01/06, 61 y.o.   MRN: [id number]  HPI Erika Ward presents today with c/o epigastric pain. She tells me when she eats, foods are slow to go down. Symptoms x 2 months. Symptoms are worse. Rice is slow to go down as well as meats. She underwent an EGD/ED in 2012. (see below) Appetite is okay. No weight loss. She is trying to diet. She is taking about 1-2 BC Powders a day.  She has a BM x 1 a day and sometimes she may go 3 days without a BM. Stools are brown. No melena or bright red rectal bleeding.  She has chronic abdominal pain as is maintained on Methadone.  EGD/ED 12/11/2010 Dysphagia and Epigastric pain:  1. 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 It was dilated with a balloon from 15-18 mm.  2. Moderate-sized sliding hiatal hernia.  3. Antral scarring from previous peptic ulcer disease, but no evidence  of active disease.     Review of Systems see hpi Current Outpatient Prescriptions  Medication Sig Dispense Refill  . ALPRAZolam (XANAX) 1 MG tablet Take 1 mg by mouth 3 (three) times daily as needed.      . Aspirin-Salicylamide-Caffeine (BC HEADACHE POWDER PO) Take by mouth.      . cyclobenzaprine (FLEXERIL) 10 MG tablet Take 10 mg by mouth 3 (three) times daily as needed.      Marland Kitchen dexlansoprazole (DEXILANT) 60 MG capsule Take 1 capsule (60 mg total) by mouth daily.  30 capsule  11  . diphenhydrAMINE (BENADRYL) 25 MG tablet Take 25 mg by mouth every 6 (six) hours as needed.      . lactobacillus acidophilus (BACID) TABS Take 2 tablets by mouth 3 (three) times daily.      Marland Kitchen levothyroxine (SYNTHROID, LEVOTHROID) 88 MCG tablet Take 88 mcg by mouth daily.      Marland Kitchen losartan-hydrochlorothiazide (HYZAAR) 100-12.5 MG per tablet Take 1 tablet by mouth daily.      Marland Kitchen lubiprostone (AMITIZA) 24 MCG capsule Take 24 mcg by mouth 2 (two) times daily with a meal.      . metFORMIN (GLUMETZA) 500 MG (MOD) 24 hr tablet Take  500 mg by mouth daily with breakfast.      . methadone (DOLOPHINE) 5 MG tablet Take 5 mg by mouth every 8 (eight) hours as needed.      . polyethylene glycol (MIRALAX / GLYCOLAX) packet Take 17 g by mouth daily.      . promethazine (PHENERGAN) 25 MG tablet Take 25 mg by mouth every 6 (six) hours as needed.      . sucralfate (CARAFATE) 1 GM/10ML suspension Take 10 mLs (1 g total) by mouth 4 (four) times daily.  420 mL  2   Past Medical History  Diagnosis Date  . Dysphagia   . GERD (gastroesophageal reflux disease)   . Diabetes     Type 2 x 1 yr  . Hypertension     1 yr  . Hypothyroid   . High cholesterol         Objective:   Physical Exam  Filed Vitals:   05/16/12 1408  BP: 144/100  Pulse: 108  Temp: 98.3 F (36.8 C)  Height: 5\' 1"  (1.549 m)  Weight: 136 lb 14.4 oz (62.097 kg)   Alert and oriented. Skin warm and dry. Oral mucosa is moist.   . Sclera anicteric, conjunctivae is pink. Thyroid not enlarged.  No cervical lymphadenopathy. Lungs clear. Heart regular rate and rhythm.  Abdomen is soft. Bowel sounds are positive. No hepatomegaly. No abdominal masses felt. No tenderness.  No edema to lower extremities.       Assessment:    GERD uncontrolled at this time. She continues to take [facility] powders for her headaches, up to 1-2 a day.  Dysphagia.  PUD needs to be ruled out. Esophageal stricture also needs to be ruled out.    Plan:    EGD/ED with Dr Louis Matte. Stop the [facility] powders.

## 2012-05-17 ENCOUNTER — Encounter (HOSPITAL_COMMUNITY): Payer: Self-pay | Admitting: Pharmacy Technician

## 2012-05-17 MED ORDER — SODIUM CHLORIDE 0.45 % IV SOLN
INTRAVENOUS | Status: DC
  Administered 2012-05-18: 14:00:00 via INTRAVENOUS

## 2012-05-18 ENCOUNTER — Ambulatory Visit (HOSPITAL_COMMUNITY)
Admission: RE | Admit: 2012-05-18 | Discharge: 2012-05-18 | Disposition: A | Discharge status: Home / Self Care | Payer: Medicare Other | Source: Ambulatory Visit | Attending: Internal Medicine | Admitting: Internal Medicine

## 2012-05-18 ENCOUNTER — Encounter (HOSPITAL_COMMUNITY): Payer: Self-pay | Admitting: *Deleted

## 2012-05-18 ENCOUNTER — Encounter (HOSPITAL_COMMUNITY)
Admission: RE | Disposition: A | Discharge status: Home / Self Care | Payer: Self-pay | Source: Ambulatory Visit | Attending: Internal Medicine

## 2012-05-18 DIAGNOSIS — E119 Type 2 diabetes mellitus without complications: Secondary | ICD-10-CM | POA: Insufficient documentation

## 2012-05-18 DIAGNOSIS — K449 Diaphragmatic hernia without obstruction or gangrene: Secondary | ICD-10-CM | POA: Insufficient documentation

## 2012-05-18 DIAGNOSIS — R12 Heartburn: Secondary | ICD-10-CM | POA: Insufficient documentation

## 2012-05-18 DIAGNOSIS — R1310 Dysphagia, unspecified: Secondary | ICD-10-CM

## 2012-05-18 DIAGNOSIS — K319 Disease of stomach and duodenum, unspecified: Secondary | ICD-10-CM | POA: Insufficient documentation

## 2012-05-18 DIAGNOSIS — K222 Esophageal obstruction: Secondary | ICD-10-CM | POA: Insufficient documentation

## 2012-05-18 DIAGNOSIS — K219 Gastro-esophageal reflux disease without esophagitis: Secondary | ICD-10-CM

## 2012-05-18 DIAGNOSIS — K2970 Gastritis, unspecified, without bleeding: Secondary | ICD-10-CM

## 2012-05-18 DIAGNOSIS — K2990 Gastroduodenitis, unspecified, without bleeding: Secondary | ICD-10-CM

## 2012-05-18 DIAGNOSIS — I10 Essential (primary) hypertension: Secondary | ICD-10-CM | POA: Insufficient documentation

## 2012-05-18 DIAGNOSIS — K2960 Other gastritis without bleeding: Secondary | ICD-10-CM | POA: Insufficient documentation

## 2012-05-18 DIAGNOSIS — R1013 Epigastric pain: Secondary | ICD-10-CM

## 2012-05-18 HISTORY — DX: Other chronic pain: G89.29

## 2012-05-18 HISTORY — PX: ESOPHAGOGASTRODUODENOSCOPY (EGD) WITH ESOPHAGEAL DILATION: SHX5812

## 2012-05-18 LAB — GLUCOSE, CAPILLARY: Glucose-Capillary: 97 mg/dL (ref 70–99)

## 2012-05-18 SURGERY — ESOPHAGOGASTRODUODENOSCOPY (EGD) WITH ESOPHAGEAL DILATION
Anesthesia: Moderate Sedation

## 2012-05-18 MED ORDER — SODIUM CHLORIDE 0.9 % IJ SOLN
INTRAMUSCULAR | Status: AC
  Filled 2012-05-18: qty 10

## 2012-05-18 MED ORDER — MIDAZOLAM HCL 5 MG/5ML IJ SOLN
INTRAMUSCULAR | Status: AC
  Filled 2012-05-18: qty 10

## 2012-05-18 MED ORDER — MEPERIDINE HCL 25 MG/ML IJ SOLN
INTRAMUSCULAR | Status: DC | PRN
  Administered 2012-05-18 (×2): 25 mg via INTRAVENOUS

## 2012-05-18 MED ORDER — MIDAZOLAM HCL 5 MG/5ML IJ SOLN
INTRAMUSCULAR | Status: DC | PRN
  Administered 2012-05-18: 1 mg via INTRAVENOUS
  Administered 2012-05-18 (×3): 4 mg via INTRAVENOUS
  Administered 2012-05-18: 2 mg via INTRAVENOUS

## 2012-05-18 MED ORDER — SIMETHICONE 40 MG/0.6ML PO SUSP
ORAL | Status: DC | PRN
  Administered 2012-05-18: 15:00:00

## 2012-05-18 MED ORDER — PROMETHAZINE HCL 25 MG/ML IJ SOLN
INTRAMUSCULAR | Status: AC
  Filled 2012-05-18: qty 1

## 2012-05-18 MED ORDER — MEPERIDINE HCL 50 MG/ML IJ SOLN
INTRAMUSCULAR | Status: AC
  Filled 2012-05-18: qty 1

## 2012-05-18 MED ORDER — BUTAMBEN-TETRACAINE-BENZOCAINE 2-2-14 % EX AERO
INHALATION_SPRAY | CUTANEOUS | Status: DC | PRN
  Administered 2012-05-18: 2 via TOPICAL

## 2012-05-18 NOTE — Op Note (Signed)
EGD PROCEDURE REPORT  PATIENT:  Erika Ward  MR#:  [id number] Birthdate:  12/22/1950, 61 y.o., female Endoscopist:  Dr. Malissa Hippo, MD Referred By:  Dr. Donzetta Sprung, MD Procedure Date: 05/18/2012  Procedure:   EGD with ED.  Indications:  Patient is 61 year old Caucasian female complicated GI history presents with intermittent solid food dysphagia recurrent heartburn and regurgitation and epigastric pain. She has history of peptic ulcer disease. She is on Nexium 40 mg twice a day. She is taking BC powder on daily basis. Her last EGD/ED was in June 2012.            Informed Consent:  The risks, benefits, alternatives & imponderables which include, but are not limited to, bleeding, infection, perforation, drug reaction and potential missed lesion have been reviewed.  The potential for biopsy, lesion removal, esophageal dilation, etc. have also been discussed.  Questions have been answered.  All parties agreeable.  Please see history & physical in medical record for more information.  Medications:  Demerol 50 mg IV Versed 15 mg IV Cetacaine spray topically for oropharyngeal anesthesia  Description of procedure:  The endoscope was introduced through the mouth and advanced to the second portion of the duodenum without difficulty or limitations. The mucosal surfaces were surveyed very carefully during advancement of the scope and upon withdrawal.  Findings:  Esophagus:  Mucosa of the esophagus was normal. Body was quite tortuous. Ring noted at GE junction. It was not critical. GEJ:  32 cm Hiatus:  36 cm and wide open. Stomach:  There was white liquid coating antral mucosa(patient took Rolaids before coming to the hospital). No food debris was present. Old in the proximal stomach were normal. Patchy antral erythema and edema noted. No erosions or ulcers noted. Pyloric channel was patent. Angularis was unremarkable. On this and cardia were examined by retroflexing the scope. Hernia was was  noted but fundal wrap was nonexistent. Duodenum:  Normal bulbar and post bulbar mucosa.  Therapeutic/Diagnostic Maneuvers Performed:   Esophageal dilation was performed using balloon dilator. Balloon dilator was passed through the scope. Guidewire was pushed into gastric lumen. Balloon dilator was positioned across GE junction and insufflated to a diameter of 18 mm subsequently to 19 mm disrupting the ring. Balloon dilator was deflated and withdrawn. Post dilation pictures taken for the record in endoscope was withdrawn.  Complications:  None  Impression: Distal esophageal ring disrupted with  balloon dilation to 19 mm. Moderate size sliding hiatal hernia. Nonerosive antral gastritis but no evidence of peptic ulcer disease or pyloric stenosis.  Recommendations:  She will continue anti-reflex measures and Nexium as before. Patient advised not to take  [facility] powder or other NSAIDs. She will call office with progress in one week.  REHMAN,NAJEEB U  05/18/2012  3:51 PM  CC: Dr. Donzetta Sprung, MD & Dr. Bonnetta Barry ref. provider found

## 2012-05-18 NOTE — H&P (Addendum)
Erika Ward is an 61 y.o. female.   Chief Complaint: Patient is here for  EGD and ED. HPI: Patient is 61 year old Caucasian female with complicated GI history who presents with worsening heartburn frequent regurgitation intermittent solid food dysphagia and epigastric pain. She complains of nausea but denies vomiting. She has intermittent hoarseness. She also denies melena or rectal bleeding. Last EGD/ED was in June 2012. Patient is taking BC powder at least twice daily. She also has chronic hoarseness.  Past Medical History  Diagnosis Date  . Dysphagia   . GERD (gastroesophageal reflux disease)   . Diabetes     Type 2 x 1 yr  . Hypertension     1 yr  . Hypothyroid   . High cholesterol   . Chronic pain     Past Surgical History  Procedure Date  . Partial hysterectomy   . Cholecystectomy   . Repair of small bowel at [facility]   . [employer] wrap     History reviewed. No pertinent family history. Social History:  reports that she has quit smoking. Her smoking use included Cigarettes. She has a 2.5 pack-year smoking history. She does not have any smokeless tobacco history on file. She reports that she does not drink alcohol or use illicit drugs.  Allergies:  Allergies  Allergen Reactions  . Reglan (Metoclopramide) Anxiety  . Zoloft (Sertraline Hcl) Other (See Comments)    Shaking   . Zofran (Ondansetron Hcl) Rash    Medications Prior to Admission  Medication Sig Dispense Refill  . ALPRAZolam (XANAX) 0.5 MG tablet Take 0.5 mg by mouth daily.      . cyclobenzaprine (FLEXERIL) 10 MG tablet Take 10 mg by mouth 3 (three) times daily as needed. Muscle Spasms      . dexlansoprazole (DEXILANT) 60 MG capsule Take 1 capsule (60 mg total) by mouth daily.  30 capsule  11  . estrogen, conjugated,-medroxyprogesterone (PREMPRO) 0.625-2.5 MG per tablet Take 1 tablet by mouth daily.      . fluticasone (FLONASE) 50 MCG/ACT nasal spray Place 2 sprays into the nose daily.      Marland Kitchen  levothyroxine (SYNTHROID, LEVOTHROID) 88 MCG tablet Take 88 mcg by mouth daily.      Marland Kitchen losartan-hydrochlorothiazide (HYZAAR) 100-12.5 MG per tablet Take 1 tablet by mouth daily.      . metFORMIN (GLUMETZA) 500 MG (MOD) 24 hr tablet Take 500 mg by mouth daily with breakfast.      . methadone (DOLOPHINE) 5 MG tablet Take 5 mg by mouth every 8 (eight) hours as needed. Pain      . Multiple Vitamin (MULTIVITAMIN WITH MINERALS) TABS Take 1 tablet by mouth daily.      . promethazine (PHENERGAN) 25 MG tablet Take 25 mg by mouth every 6 (six) hours as needed. Nausea and Vomiting      . sucralfate (CARAFATE) 1 GM/10ML suspension Take 10 mLs (1 g total) by mouth 4 (four) times daily.  420 mL  2  . Aspirin-Salicylamide-Caffeine (BC HEADACHE POWDER PO) Take by mouth.      . diphenhydrAMINE (BENADRYL) 25 MG tablet Take 25 mg by mouth every 6 (six) hours as needed. Allergies      . naproxen sodium (ALEVE) 220 MG tablet Take 220 mg by mouth 2 (two) times daily as needed.      . polyethylene glycol (MIRALAX / GLYCOLAX) packet Take 17 g by mouth daily.        Results for orders placed during the  hospital encounter of 05/18/12 (from the past 48 hour(s))  GLUCOSE, CAPILLARY     Status: Normal   Collection Time   05/18/12  2:14 PM      Component Value Range Comment   Glucose-Capillary 97  70 - 99 mg/dL    Comment 1 Documented in Chart      No results found.  ROS  Blood pressure 121/74, pulse 99, temperature 97.5 F (36.4 C), temperature source Oral, resp. rate 13, height 5\' 1"  (1.549 m), weight 136 lb (61.689 kg), SpO2 98.00%. Physical Exam  Constitutional: She appears well-developed and well-nourished.  HENT:  Mouth/Throat: Oropharynx is clear and moist.  Eyes: Conjunctivae normal are normal. No scleral icterus.  Neck: No thyromegaly present.  Cardiovascular: Normal rate, regular rhythm and normal heart sounds.   No murmur heard. Respiratory: Effort normal and breath sounds normal.  GI: Soft. She  exhibits no distension and no mass. There is Tenderness: mild generalized tenderness but moderate at epigastrium..  Musculoskeletal: She exhibits no edema.  Lymphadenopathy:    She has no cervical adenopathy.  Neurological: She is alert.  Skin: Skin is warm and dry.     Assessment/Plan Refracturing GERD, epigastric pain and solid food dysphagia. EGD and possible ED.  Jalayla Chrismer U 05/18/2012, 3:19 PM

## 2012-05-23 ENCOUNTER — Encounter (HOSPITAL_COMMUNITY): Payer: Self-pay | Admitting: Internal Medicine

## 2012-05-30 ENCOUNTER — Ambulatory Visit (INDEPENDENT_AMBULATORY_CARE_PROVIDER_SITE_OTHER): Payer: 59 | Admitting: Internal Medicine

## 2012-08-01 ENCOUNTER — Other Ambulatory Visit (INDEPENDENT_AMBULATORY_CARE_PROVIDER_SITE_OTHER): Payer: Self-pay | Admitting: Internal Medicine

## 2012-11-06 ENCOUNTER — Encounter (HOSPITAL_COMMUNITY): Payer: Self-pay | Admitting: *Deleted

## 2012-11-06 ENCOUNTER — Emergency Department (HOSPITAL_COMMUNITY)
Admission: EM | Admit: 2012-11-06 | Discharge: 2012-11-06 | Disposition: A | Discharge status: Home / Self Care | Payer: Medicare Other | Attending: Emergency Medicine | Admitting: Emergency Medicine

## 2012-11-06 ENCOUNTER — Emergency Department (HOSPITAL_COMMUNITY): Payer: Medicare Other

## 2012-11-06 DIAGNOSIS — K219 Gastro-esophageal reflux disease without esophagitis: Secondary | ICD-10-CM | POA: Insufficient documentation

## 2012-11-06 DIAGNOSIS — Z79899 Other long term (current) drug therapy: Secondary | ICD-10-CM | POA: Insufficient documentation

## 2012-11-06 DIAGNOSIS — E039 Hypothyroidism, unspecified: Secondary | ICD-10-CM | POA: Insufficient documentation

## 2012-11-06 DIAGNOSIS — Z87891 Personal history of nicotine dependence: Secondary | ICD-10-CM | POA: Insufficient documentation

## 2012-11-06 DIAGNOSIS — F411 Generalized anxiety disorder: Secondary | ICD-10-CM | POA: Insufficient documentation

## 2012-11-06 DIAGNOSIS — Y92009 Unspecified place in unspecified non-institutional (private) residence as the place of occurrence of the external cause: Secondary | ICD-10-CM | POA: Insufficient documentation

## 2012-11-06 DIAGNOSIS — S73101A Unspecified sprain of right hip, initial encounter: Secondary | ICD-10-CM

## 2012-11-06 DIAGNOSIS — Y939 Activity, unspecified: Secondary | ICD-10-CM | POA: Insufficient documentation

## 2012-11-06 DIAGNOSIS — G8929 Other chronic pain: Secondary | ICD-10-CM | POA: Insufficient documentation

## 2012-11-06 DIAGNOSIS — I10 Essential (primary) hypertension: Secondary | ICD-10-CM | POA: Insufficient documentation

## 2012-11-06 DIAGNOSIS — S63615A Unspecified sprain of left ring finger, initial encounter: Secondary | ICD-10-CM

## 2012-11-06 DIAGNOSIS — E119 Type 2 diabetes mellitus without complications: Secondary | ICD-10-CM | POA: Insufficient documentation

## 2012-11-06 DIAGNOSIS — Z8639 Personal history of other endocrine, nutritional and metabolic disease: Secondary | ICD-10-CM | POA: Insufficient documentation

## 2012-11-06 DIAGNOSIS — S63255A Unspecified dislocation of left ring finger, initial encounter: Secondary | ICD-10-CM

## 2012-11-06 DIAGNOSIS — S63279A Dislocation of unspecified interphalangeal joint of unspecified finger, initial encounter: Secondary | ICD-10-CM | POA: Insufficient documentation

## 2012-11-06 DIAGNOSIS — Z862 Personal history of diseases of the blood and blood-forming organs and certain disorders involving the immune mechanism: Secondary | ICD-10-CM | POA: Insufficient documentation

## 2012-11-06 DIAGNOSIS — W010XXA Fall on same level from slipping, tripping and stumbling without subsequent striking against object, initial encounter: Secondary | ICD-10-CM | POA: Insufficient documentation

## 2012-11-06 DIAGNOSIS — IMO0002 Reserved for concepts with insufficient information to code with codable children: Secondary | ICD-10-CM | POA: Insufficient documentation

## 2012-11-06 DIAGNOSIS — S6390XA Sprain of unspecified part of unspecified wrist and hand, initial encounter: Secondary | ICD-10-CM | POA: Insufficient documentation

## 2012-11-06 DIAGNOSIS — W19XXXA Unspecified fall, initial encounter: Secondary | ICD-10-CM

## 2012-11-06 MED ORDER — HYDROMORPHONE HCL PF 1 MG/ML IJ SOLN
1.0000 mg | Freq: Once | INTRAMUSCULAR | Status: AC
  Administered 2012-11-06: 1 mg via INTRAMUSCULAR
  Filled 2012-11-06: qty 1

## 2012-11-06 NOTE — ED Provider Notes (Signed)
History     CSN: [id number]  Arrival date & time 11/06/12  0139   First MD Initiated Contact with Patient 11/06/12 0203      Chief Complaint - fall   Patient is a 63 y.o. female presenting with fall. The history is provided by the patient.  Fall This is a new problem. Episode onset: just prior to arrival. The problem has not changed since onset.Pertinent negatives include no chest pain, no abdominal pain, no headaches and no shortness of breath. Exacerbated by: movement. The symptoms are relieved by rest. She has tried rest for the symptoms. The treatment provided no relief.  pt reports she tripped fell at home and after falling she injured her left ring finger She also reports she has right hip pain No head injury and no LOC No cp/sob No dizziness No abd pain No new back or neck pain No focal weakness  She reports severe pain in her left ring finger as she thinks she has dislocated the finger  Past Medical History  Diagnosis Date  . Dysphagia   . GERD (gastroesophageal reflux disease)   . Diabetes     Type 2 x 1 yr  . Hypertension     1 yr  . Hypothyroid   . High cholesterol   . Chronic pain     Past Surgical History  Procedure Laterality Date  . Partial hysterectomy    . Cholecystectomy    . Repair of small bowel at [facility]    . Nissan wrap    . Esophagogastroduodenoscopy (egd) with esophageal dilation  05/18/2012    Procedure: ESOPHAGOGASTRODUODENOSCOPY (EGD) WITH ESOPHAGEAL DILATION;  Surgeon: Malissa Hippo, MD;  Location: [facility];  Service: Endoscopy;  Laterality: N/A;  320    History reviewed. No pertinent family history.  History  Substance Use Topics  . Smoking status: Former Smoker -- 0.50 packs/day for 5 years    Types: Cigarettes  . Smokeless tobacco: Not on file  . Alcohol Use: No    OB History   Grav Para Term Preterm Abortions TAB SAB Ect Mult Living                  Review of Systems  Constitutional: Negative for  fatigue.  HENT: Negative for neck pain.   Respiratory: Negative for shortness of breath.   Cardiovascular: Negative for chest pain.  Gastrointestinal: Negative for vomiting and abdominal pain.  Musculoskeletal: Positive for joint swelling.  Neurological: Negative for weakness and headaches.  Psychiatric/Behavioral: The patient is nervous/anxious.   All other systems reviewed and are negative.    Allergies  Reglan; Zoloft; and Zofran  Home Medications   Current Outpatient Rx  Name  Route  Sig  Dispense  Refill  . ALPRAZolam (XANAX) 0.5 MG tablet   Oral   Take 0.5 mg by mouth daily.         . cyclobenzaprine (FLEXERIL) 10 MG tablet   Oral   Take 10 mg by mouth 3 (three) times daily as needed. Muscle Spasms         . DEXILANT 60 MG capsule      TAKE 1 CAPSULE BY MOUTH EVERY DAY   90 capsule   3     patient wants a 90 day supply   . diphenhydrAMINE (BENADRYL) 25 MG tablet   Oral   Take 25 mg by mouth every 6 (six) hours as needed. Allergies         . estrogen,  conjugated,-medroxyprogesterone (PREMPRO) 0.625-2.5 MG per tablet   Oral   Take 1 tablet by mouth daily.         . fluticasone (FLONASE) 50 MCG/ACT nasal spray   Nasal   Place 2 sprays into the nose daily.         Marland Kitchen levothyroxine (SYNTHROID, LEVOTHROID) 88 MCG tablet   Oral   Take 88 mcg by mouth daily.         Marland Kitchen losartan-hydrochlorothiazide (HYZAAR) 100-12.5 MG per tablet   Oral   Take 1 tablet by mouth daily.         . metFORMIN (GLUMETZA) 500 MG (MOD) 24 hr tablet   Oral   Take 500 mg by mouth daily with breakfast.         . methadone (DOLOPHINE) 5 MG tablet   Oral   Take 5 mg by mouth every 8 (eight) hours as needed. Pain         . Multiple Vitamin (MULTIVITAMIN WITH MINERALS) TABS   Oral   Take 1 tablet by mouth daily.         . naproxen sodium (ALEVE) 220 MG tablet   Oral   Take 220 mg by mouth 2 (two) times daily as needed.         . polyethylene glycol (MIRALAX /  GLYCOLAX) packet   Oral   Take 17 g by mouth daily.         . promethazine (PHENERGAN) 25 MG tablet   Oral   Take 25 mg by mouth every 6 (six) hours as needed. Nausea and Vomiting         . sucralfate (CARAFATE) 1 GM/10ML suspension   Oral   Take 10 mLs (1 g total) by mouth 4 (four) times daily.   420 mL   2     BP 148/94  Pulse 110  Temp(Src) 97.9 F (36.6 C) (Oral)  Resp 18  Ht 5\' 1"  (1.549 m)  Wt 133 lb (60.328 kg)  BMI 25.14 kg/m2  SpO2 99%  Physical Exam CONSTITUTIONAL: Well developed/well nourished HEAD: Normocephalic/atraumatic EYES: EOMI/PERRL ENMT: Mucous membranes moist, No evidence of facial/nasal trauma NECK: supple no meningeal signs SPINE:entire spine nontender, No bruising/crepitance/stepoffs noted to spine CV: S1/S2 noted, no murmurs/rubs/gallops noted LUNGS: Lungs are clear to auscultation bilaterally, no apparent distress ABDOMEN: soft, nontender, no rebound or guarding GU:no cva tenderness NEURO: Pt is awake/alert, moves all extremitiesx4 EXTREMITIES: pulses normal, full ROM. Obvious deformity of left ring finger at PIP with ulnar/lateral displacement of distal portion. No laceration is noted. No abrasions noted  No other hand/wrist/snuffbox tenderness noted.  She has tenderness right ROM of right hip but no deformity All other extremities/joints palpated/ranged and nontender SKIN: warm, color normal PSYCH: no abnormalities of mood noted  ED Course  Reduction of dislocation Performed by: Joya Gaskins Authorized by: Joya Gaskins Consent: Verbal consent obtained. Risks and benefits: risks, benefits and alternatives were discussed Consent given by: patient Local anesthesia used: no Patient sedated: no Patient tolerance: Patient tolerated the procedure well with no immediate complications. Comments: Pt with obvious deformity to left ring finger with dislocation at PIP with ulnar displacement of distal portion The dislocation was  reduced by traction and patient tolerated procedure well      Labs Reviewed - No data to display Dg Hip Complete Right  11/06/2012   *RADIOLOGY REPORT*  Clinical Data: Injury to right hip, with right hip pain.  RIGHT HIP - COMPLETE 2+ VIEW  Comparison: None.  Findings: There is no evidence of fracture or dislocation.  Both femoral heads are seated normally within their respective acetabula.  The proximal right femur appears intact.  Mild degenerative change is noted at the lower lumbar spine.  The sacroiliac joints are unremarkable in appearance.  The visualized bowel gas pattern is grossly unremarkable in appearance.  Scattered phleboliths are noted within the pelvis. Scattered clips are noted at the lower abdomen.  IMPRESSION: No evidence of fracture or dislocation.   Original Report Authenticated By: Tonia Ghent, M.D.   Dg Hand Complete Left  11/06/2012   *RADIOLOGY REPORT*  Clinical Data: Injury to left hand; hit hand on dog gate.  Left hand pain.  LEFT HAND - COMPLETE 3+ VIEW  Comparison: None.  Findings: There is no evidence of fracture or dislocation.  The joint spaces are preserved; the soft tissues are unremarkable in appearance.  Mild degenerative change is noted at the proximal carpal row, with subcortical cystic change, but the carpal rows demonstrate normal alignment.  IMPRESSION:  1.  No evidence of fracture or dislocation. 2.  Mild degenerative change noted at the proximal carpal row.   Original Report Authenticated By: Tonia Ghent, M.D.     1. Fall, initial encounter   2. Sprain of left ring finger, initial encounter   3. Hip sprain, right, initial encounter    After verbal consent was obtained and patient given pain meds, I performed closed reduction of left ring finger dislocation.  She tolerated this well.  Imaging performed and she was without acute fx.  Finger splint applied.  Advised followup with orthopedics Pt gave permission for nursing to cut and remove ring from her  finger  MDM  Nursing notes including past medical history and social history reviewed and considered in documentation xrays reviewed and considered         Joya Gaskins, MD 11/06/12 [phone]

## 2012-11-06 NOTE — ED Notes (Signed)
Pt reports tripping and falling hurting ring finger of left hand.  Pt unsure which position hand was in when she fell.  Obvious displacement of left ring finger.

## 2012-11-06 NOTE — ED Notes (Signed)
Ring removed with ring cutter

## 2013-06-10 ENCOUNTER — Other Ambulatory Visit (INDEPENDENT_AMBULATORY_CARE_PROVIDER_SITE_OTHER): Payer: Self-pay | Admitting: Internal Medicine

## 2014-02-15 ENCOUNTER — Telehealth (INDEPENDENT_AMBULATORY_CARE_PROVIDER_SITE_OTHER): Payer: Self-pay | Admitting: *Deleted

## 2014-02-15 NOTE — Telephone Encounter (Signed)
Erika Ward called wanting an apt to see someone for chest and abd pain with a sour taste in her mouth. Would like for [name] to please return her call at [phone]. souar

## 2014-02-15 NOTE — Telephone Encounter (Signed)
Erika Ward, She wants an OV. Not urgent.

## 2014-02-15 NOTE — Telephone Encounter (Signed)
Message sent to [facility] for OV

## 2014-02-23 NOTE — Telephone Encounter (Signed)
LM for patient to return the call to schedule an apt.

## 2014-02-23 NOTE — Telephone Encounter (Signed)
Apt has been scheduled for 02/27/14 at 11:30 am with Deberah Castle, NP.

## 2014-02-27 ENCOUNTER — Ambulatory Visit (INDEPENDENT_AMBULATORY_CARE_PROVIDER_SITE_OTHER): Payer: Medicare Other | Admitting: Internal Medicine

## 2014-02-27 ENCOUNTER — Encounter (INDEPENDENT_AMBULATORY_CARE_PROVIDER_SITE_OTHER): Payer: Self-pay | Admitting: Internal Medicine

## 2014-02-27 ENCOUNTER — Encounter (INDEPENDENT_AMBULATORY_CARE_PROVIDER_SITE_OTHER): Payer: Self-pay | Admitting: *Deleted

## 2014-02-27 ENCOUNTER — Other Ambulatory Visit (INDEPENDENT_AMBULATORY_CARE_PROVIDER_SITE_OTHER): Payer: Self-pay | Admitting: *Deleted

## 2014-02-27 VITALS — BP 142/98 | HR 84 | Temp 98.6°F | Ht 61.0 in | Wt 132.3 lb

## 2014-02-27 DIAGNOSIS — K219 Gastro-esophageal reflux disease without esophagitis: Secondary | ICD-10-CM

## 2014-02-27 DIAGNOSIS — R1310 Dysphagia, unspecified: Secondary | ICD-10-CM

## 2014-02-27 NOTE — Progress Notes (Addendum)
Subjective:    Patient ID: Erika Ward, female    DOB: March 23, 1951, 63 y.o.   MRN: [id number]  HPI  Presents today with c/o dysphagia. She has chest pain and epigastric pain. She says she has been to [facility] x 2 for this pain.  She says when the pain becomes bad, she has trouble breathing. She tells me her cardiac workup in the ED was negative. Told it was acid reflux.  Given an Rx for a GI Cocktail which helped some.  She does have some dysphagia. She cannot eat meats. Hurts to swallow meats.  If she eats a donut, she will choke. She cannot eat a hamburger. It will lodge. Dysphagia occurs on a daily basis.  Appetite is okay. Weight loss of 4 pounds since 2013.  She usually has a BM x 3 a day.  No melena or BRRB. She has been on Metha done for chronic abdominal pain since 2006. Takes BC powders maybe twice a week.  02/07/2014 Troponin  Less than 0.01.  8./26/2015 chest pain: Ct abdomen/pelvis with CM:  No evidenfe for acute PE. No dissection of the thoracoabdominal aorta. No throracoabdominal aortic aneurysm.    05/28/2012 EGD/ED: Dr. Laural Golden:  Indications: Patient is 63 year old Caucasian female complicated GI history presents with intermittent solid food dysphagia recurrent heartburn and regurgitation and epigastric pain. She has history of peptic ulcer disease. She is on Nexium 40 mg twice a day. She is taking BC powder on daily basis. Her last EGD/ED was in June 2012.  Impression:  Distal esophageal ring disrupted with balloon dilation to 19 mm.  Moderate size sliding hiatal hernia.  Nonerosive antral gastritis but no evidence of peptic ulcer disease or pyloric stenosis.  Recommendations:  She will continue anti-reflex measures and Nexium as before.  Patient advised not to take [facility] powder or other NSAIDs.  She will call office with progress in one week.   12/10/2013 EGD/ED Dr.Rehman: INDICATIONS: Francie is a 63 year old Caucasian female with very  complicated GI history who presents  with solid food dysphagia as well as  epigastric pain. She also has a history of peptic ulcer disease. She  is taking BC powder at least 3-4 times a day. She has chronic abdominal  pain and maintained on methadone. She is also on PPI chronically.  The patient's last EGD ED was at the [facility] in May 2010 with [facility] system.  FINAL DIAGNOSES:  1. 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 It was dilated with a balloon from 15-18 mm.  2. Moderate-sized sliding hiatal hernia.  3. Antral scarring from previous peptic ulcer disease, but no evidence  of active disease.   Review of Systems Past Medical History  Diagnosis Date  . Dysphagia   . GERD (gastroesophageal reflux disease)   . Diabetes     Type 2 x 1 yr  . Hypertension     1 yr  . Hypothyroid   . High cholesterol   . Chronic pain     Past Surgical History  Procedure Laterality Date  . Partial hysterectomy    . Cholecystectomy    . Repair of small bowel at [facility]    . Nissan wrap    . Esophagogastroduodenoscopy (egd) with esophageal dilation  05/18/2012    Procedure: ESOPHAGOGASTRODUODENOSCOPY (EGD) WITH ESOPHAGEAL DILATION;  Surgeon: Rogene Houston, MD;  Location: [facility];  Service: Endoscopy;  Laterality: N/A;  320    Allergies  Allergen Reactions  .  Reglan [Metoclopramide] Anxiety  . Crestor [Rosuvastatin]     Aching in legs  . Prozac [Fluoxetine Hcl]   . Zoloft [Sertraline Hcl] Other (See Comments)    Shaking   . Zofran [Ondansetron Hcl] Rash    Current Outpatient Prescriptions on File Prior to Visit  Medication Sig Dispense Refill  . ALPRAZolam (XANAX) 0.5 MG tablet Take 0.5 mg by mouth daily.      . cyclobenzaprine (FLEXERIL) 10 MG tablet Take 10 mg by mouth 3 (three) times daily as needed. Muscle Spasms      . diphenhydrAMINE (BENADRYL) 25 MG tablet Take 25 mg by mouth every 6 (six) hours as needed. Allergies      . fluticasone (FLONASE) 50 MCG/ACT nasal spray Place  2 sprays into the nose daily.      Marland Kitchen levothyroxine (SYNTHROID, LEVOTHROID) 88 MCG tablet Take 75 mcg by mouth daily.       . metFORMIN (GLUMETZA) 500 MG (MOD) 24 hr tablet Take 500 mg by mouth daily with breakfast.      . methadone (DOLOPHINE) 5 MG tablet Take 5 mg by mouth every 8 (eight) hours as needed. Pain      . Multiple Vitamin (MULTIVITAMIN WITH MINERALS) TABS Take 1 tablet by mouth daily.      . naproxen sodium (ALEVE) 220 MG tablet Take 220 mg by mouth 2 (two) times daily as needed.      . promethazine (PHENERGAN) 25 MG tablet Take 25 mg by mouth every 6 (six) hours as needed. Nausea and Vomiting       No current facility-administered medications on file prior to visit.        Objective:   Physical Exam  Filed Vitals:   02/27/14 1120  BP: 142/98  Pulse: 84  Temp: 98.6 F (37 C)  Height: 5\' 1"  (1.549 m)  Weight: 132 lb 4.8 oz (60.011 kg)  Alert and oriented. Skin warm and dry. Oral mucosa is moist.   . Sclera anicteric, conjunctivae is pink. Thyroid not enlarged. No cervical lymphadenopathy. Lungs clear. Heart regular rate and rhythm.  Abdomen is soft. Bowel sounds are positive. No hepatomegaly. No abdominal masses felt. Diffuse  tenderness.  No edema to lower extremities.           Assessment & Plan:  GERD. Dysphagia. PUD needs to be ruled out. EGD/ED. The risks and benefits such as perforation, bleeding, and infection were reviewed with the patient and is agreeable.

## 2014-02-27 NOTE — Patient Instructions (Signed)
EGD/ED Protonix 40mg  one in am and one in pm.

## 2014-03-19 ENCOUNTER — Encounter (HOSPITAL_COMMUNITY): Payer: Self-pay | Admitting: Pharmacy Technician

## 2014-03-30 ENCOUNTER — Encounter (HOSPITAL_COMMUNITY): Payer: Self-pay | Admitting: *Deleted

## 2014-04-02 ENCOUNTER — Encounter (HOSPITAL_COMMUNITY)
Admission: RE | Disposition: A | Discharge status: Home / Self Care | Payer: Self-pay | Source: Ambulatory Visit | Attending: Internal Medicine

## 2014-04-02 ENCOUNTER — Encounter (HOSPITAL_COMMUNITY): Payer: Self-pay | Admitting: *Deleted

## 2014-04-02 ENCOUNTER — Ambulatory Visit (HOSPITAL_COMMUNITY)
Admission: RE | Admit: 2014-04-02 | Discharge: 2014-04-02 | Disposition: A | Discharge status: Home / Self Care | Payer: Medicare Other | Source: Ambulatory Visit | Attending: Internal Medicine | Admitting: Internal Medicine

## 2014-04-02 DIAGNOSIS — E119 Type 2 diabetes mellitus without complications: Secondary | ICD-10-CM | POA: Diagnosis not present

## 2014-04-02 DIAGNOSIS — K449 Diaphragmatic hernia without obstruction or gangrene: Secondary | ICD-10-CM | POA: Diagnosis not present

## 2014-04-02 DIAGNOSIS — Z79899 Other long term (current) drug therapy: Secondary | ICD-10-CM | POA: Diagnosis not present

## 2014-04-02 DIAGNOSIS — E780 Pure hypercholesterolemia: Secondary | ICD-10-CM | POA: Diagnosis not present

## 2014-04-02 DIAGNOSIS — I10 Essential (primary) hypertension: Secondary | ICD-10-CM | POA: Insufficient documentation

## 2014-04-02 DIAGNOSIS — Z7951 Long term (current) use of inhaled steroids: Secondary | ICD-10-CM | POA: Insufficient documentation

## 2014-04-02 DIAGNOSIS — E039 Hypothyroidism, unspecified: Secondary | ICD-10-CM | POA: Diagnosis not present

## 2014-04-02 DIAGNOSIS — R079 Chest pain, unspecified: Secondary | ICD-10-CM | POA: Diagnosis not present

## 2014-04-02 DIAGNOSIS — K219 Gastro-esophageal reflux disease without esophagitis: Secondary | ICD-10-CM | POA: Insufficient documentation

## 2014-04-02 DIAGNOSIS — K3189 Other diseases of stomach and duodenum: Secondary | ICD-10-CM

## 2014-04-02 DIAGNOSIS — K222 Esophageal obstruction: Secondary | ICD-10-CM | POA: Diagnosis not present

## 2014-04-02 DIAGNOSIS — Z87891 Personal history of nicotine dependence: Secondary | ICD-10-CM | POA: Diagnosis not present

## 2014-04-02 DIAGNOSIS — Q398 Other congenital malformations of esophagus: Secondary | ICD-10-CM | POA: Insufficient documentation

## 2014-04-02 DIAGNOSIS — R1310 Dysphagia, unspecified: Secondary | ICD-10-CM

## 2014-04-02 HISTORY — PX: MALONEY DILATION: SHX5535

## 2014-04-02 HISTORY — PX: ESOPHAGOGASTRODUODENOSCOPY: SHX5428

## 2014-04-02 LAB — GLUCOSE, CAPILLARY: Glucose-Capillary: 108 mg/dL — ABNORMAL HIGH (ref 70–99)

## 2014-04-02 SURGERY — EGD (ESOPHAGOGASTRODUODENOSCOPY)
Anesthesia: Moderate Sedation

## 2014-04-02 MED ORDER — SODIUM CHLORIDE 0.9 % IV SOLN
INTRAVENOUS | Status: DC
  Administered 2014-04-02: 07:00:00 via INTRAVENOUS

## 2014-04-02 MED ORDER — STERILE WATER FOR IRRIGATION IR SOLN
Status: DC | PRN
  Administered 2014-04-02: 08:00:00

## 2014-04-02 MED ORDER — MIDAZOLAM HCL 5 MG/5ML IJ SOLN
INTRAMUSCULAR | Status: DC | PRN
  Administered 2014-04-02 (×4): 3 mg via INTRAVENOUS

## 2014-04-02 MED ORDER — MIDAZOLAM HCL 5 MG/5ML IJ SOLN
INTRAMUSCULAR | Status: AC
  Filled 2014-04-02: qty 5

## 2014-04-02 MED ORDER — MEPERIDINE HCL 50 MG/ML IJ SOLN
INTRAMUSCULAR | Status: AC
  Filled 2014-04-02: qty 1

## 2014-04-02 MED ORDER — PANTOPRAZOLE SODIUM 40 MG PO TBEC: 40.0000 mg | DELAYED_RELEASE_TABLET | Freq: Two times a day (BID) | ORAL | Status: DC

## 2014-04-02 MED ORDER — MIDAZOLAM HCL 5 MG/5ML IJ SOLN
INTRAMUSCULAR | Status: AC
  Filled 2014-04-02: qty 10

## 2014-04-02 MED ORDER — MEPERIDINE HCL 50 MG/ML IJ SOLN
INTRAMUSCULAR | Status: DC | PRN
  Administered 2014-04-02 (×2): 25 mg via INTRAVENOUS

## 2014-04-02 MED ORDER — BUTAMBEN-TETRACAINE-BENZOCAINE 2-2-14 % EX AERO
INHALATION_SPRAY | CUTANEOUS | Status: DC | PRN
  Administered 2014-04-02: 3 via TOPICAL

## 2014-04-02 NOTE — Discharge Instructions (Signed)
Do not take Alka-Seltzer or other over-the-counter arthritis medications such as Advil, Aleve or Naprosyn. Increase pantoprazole to 40 mg by mouth 30 minutes before breakfast and evening meal daily. Resume medications as before. Resume usual diet. No driving for 24 hours. Patient will call with biopsy results.   Esophagogastroduodenoscopy Care After Refer to this sheet in the next few weeks. These instructions provide you with information on caring for yourself after your procedure. Your caregiver may also give you more specific instructions. Your treatment has been planned according to current medical practices, but problems sometimes occur. Call your caregiver if you have any problems or questions after your procedure.  HOME CARE INSTRUCTIONS  Do not eat or drink anything until the numbing medicine (local anesthetic) has worn off and your gag reflex has returned. You will know that the local anesthetic has worn off when you can swallow comfortably.  Do not drive for 12 hours after the procedure or as directed by your caregiver.  Only take medicines as directed by your caregiver. SEEK MEDICAL CARE IF:   You cannot stop coughing.  You are not urinating at all or less than usual. SEEK IMMEDIATE MEDICAL CARE IF:  You have difficulty swallowing.  You cannot eat or drink.  You have worsening throat or chest pain.  You have dizziness, lightheadedness, or you faint.  You have nausea or vomiting.  You have chills.  You have a fever.  You have severe abdominal pain.  You have black, tarry, or bloody stools. Document Released: 05/18/2012 Document Reviewed: 05/18/2012 [facility] Patient Information 2015 [employer]. This information is not intended to replace advice given to you by your health care provider. Make sure you discuss any questions you have with your health care provider.

## 2014-04-02 NOTE — H&P (Signed)
Erika Ward is an 63 y.o. female.   Chief Complaint: Patient is here for EGD and ED. HPI: Patient is 63 year old Caucasian female with a complicated GI history who presents with recurrent dysphagia to solids she also complains of chest pain and intermittent heartburn. Cardiac workup has been negative. He points to start intravenous bolus obstruction. She says she has lost 3 pounds. She denies Melena or rectal bleeding. She has nausea but vomiting. She has been chewing up to as many as 4 Alka-Seltzer for relief.  She has chronic abdominal pain and has been on methadone. Last EGD/ED was in December 2013.  Past Medical History  Diagnosis Date  . Dysphagia   . GERD (gastroesophageal reflux disease)   . Diabetes     Type 2 x 1 yr  . Hypertension     1 yr  . Hypothyroid   . High cholesterol   . Chronic pain     Past Surgical History  Procedure Laterality Date  . Partial hysterectomy    . Cholecystectomy    . Repair of small bowel at [facility]    . Nissan wrap    . Esophagogastroduodenoscopy (egd) with esophageal dilation  05/18/2012    Procedure: ESOPHAGOGASTRODUODENOSCOPY (EGD) WITH ESOPHAGEAL DILATION;  Surgeon: Rogene Houston, MD;  Location: [facility];  Service: Endoscopy;  Laterality: N/A;  320    History reviewed. No pertinent family history. Social History:  reports that she has quit smoking. Her smoking use included Cigarettes. She has a 2.5 pack-year smoking history. She does not have any smokeless tobacco history on file. She reports that she does not drink alcohol or use illicit drugs.  Allergies:  Allergies  Allergen Reactions  . Reglan [Metoclopramide] Anxiety  . Crestor [Rosuvastatin]     Aching in legs  . Norvasc [Amlodipine Besylate] Other (See Comments)    Swells legs  . Prozac [Fluoxetine Hcl]   . Valsartan Other (See Comments)    Dehydrate,cramp  . Zoloft [Sertraline Hcl] Other (See Comments)    Shaking   . Zofran [Ondansetron Hcl] Rash     Medications Prior to Admission  Medication Sig Dispense Refill  . ALPRAZolam (XANAX) 0.5 MG tablet Take 0.5 mg by mouth daily.      . cyclobenzaprine (FLEXERIL) 10 MG tablet Take 10 mg by mouth 3 (three) times daily as needed. Muscle Spasms      . fluticasone (FLONASE) 50 MCG/ACT nasal spray Place 2 sprays into the nose daily.      Marland Kitchen levothyroxine (SYNTHROID, LEVOTHROID) 75 MCG tablet Take 75 mcg by mouth daily before breakfast.      . metFORMIN (GLUCOPHAGE) 500 MG tablet Take 500 mg by mouth daily with breakfast.      . methadone (DOLOPHINE) 5 MG tablet Take 5 mg by mouth every 8 (eight) hours as needed. Pain      . Multiple Vitamin (MULTIVITAMIN WITH MINERALS) TABS Take 1 tablet by mouth daily.      . pantoprazole (PROTONIX) 40 MG tablet Take 40 mg by mouth daily.      . bisacodyl (DULCOLAX) 5 MG EC tablet Take 5 mg by mouth daily as needed for moderate constipation.      . diphenhydrAMINE (BENADRYL) 25 MG tablet Take 25 mg by mouth every 6 (six) hours as needed. Allergies      . naproxen sodium (ALEVE) 220 MG tablet Take 220 mg by mouth 2 (two) times daily as needed (pain).       Marland Kitchen  promethazine (PHENERGAN) 25 MG tablet Take 25 mg by mouth every 6 (six) hours as needed. Nausea and Vomiting        No results found for this or any previous visit (from the past 48 hour(s)). No results found.  ROS  Blood pressure 136/90, pulse 87, temperature 98.1 F (36.7 C), temperature source Oral, resp. rate 12, height 5\' 1"  (1.549 m), weight 132 lb (59.875 kg), SpO2 98.00%. Physical Exam  Constitutional: She appears well-developed and well-nourished.  HENT:  Mouth/Throat: Oropharynx is clear and moist.  Eyes: Conjunctivae are normal. No scleral icterus.  Neck: No thyromegaly present.  Cardiovascular: Normal rate, regular rhythm and normal heart sounds.   No murmur heard. Respiratory: Effort normal and breath sounds normal.  Chest wall tenderness at the mid and lower sternum area.  GI: Soft.  She exhibits no distension and no mass. Tenderness: mild midepigastric tenderness. There is no rebound.  Musculoskeletal: She exhibits no edema.  Lymphadenopathy:    She has no cervical adenopathy.  Neurological: She is alert.  Skin: Skin is warm and dry.     Assessment/Plan Solid food dysphagia in patient with chronic GERD. Chest pain appears to be multifactorial ie wall pain and secondary to GERD. EGD with ED.  REHMAN,NAJEEB U 04/02/2014, 7:35 AM

## 2014-04-02 NOTE — Op Note (Signed)
EGD PROCEDURE REPORT  PATIENT:  Erika Ward  MR#:  [id number] Birthdate:  [birth year], 63 y.o., female Endoscopist:  Dr. Rogene Houston, MD Referred By:  Dr. Gar Ponto, MD  Procedure Date: 04/02/2014  Procedure:   EGD with ED.  Indications:  Patient is  63 year old Caucasian female with multiple medical problems including GERD and history of peptic ulcer disease who presents with recurrent solid food dysphagia. She also complains of daily heartburn, epigastric and chest pain. Chest pain appears to be musculoskeletal pain. Last EGD/ED was in December 2013.            Informed Consent:  The risks, benefits, alternatives & imponderables which include, but are not limited to, bleeding, infection, perforation, drug reaction and potential missed lesion have been reviewed.  The potential for biopsy, lesion removal, esophageal dilation, etc. have also been discussed.  Questions have been answered.  All parties agreeable.  Please see history & physical in medical record for more information.  Medications:  Demerol 50 mg IV Versed 12 mg IV Cetacaine spray topically for oropharyngeal anesthesia  Description of procedure:  The endoscope was introduced through the mouth and advanced to the second portion of the duodenum without difficulty or limitations. The mucosal surfaces were surveyed very carefully during advancement of the scope and upon withdrawal.  Findings:  Esophagus:  There appeared to be coating of thin layer of food debris. Tortuous distal esophagus. Non-critical ring noted at GE junction. GEJ:  29 cm Hiatus:  33 cm Stomach:  Stomach was empty and distended very well with insufflation. Folds in the proximal stomach were somewhat atrophic. Mucosa and body was normal. Antral scar noted. Pyloric channel was patent. Angularis fundus and cardia were unremarkable. Hernia was easily seen on this view. Duodenum:  Bulbar and post bulbar mucosa was normal.  Therapeutic/Diagnostic Maneuvers  Performed:   Balloon dilator was passed through the scope. Guidewire was pushed into the gastric lumen. Balloon dilator was positioned across GE junction by withdrawing the scope into body of esophagus. It was insufflated to diameter of 18 mm but the ring was still intact. Insufflation was then increased to 19 mm and maintained for  few seconds the balloon was then passed distally. The ring was noted to have been disrupted. Balloon was deflated and withdrawn. Biopsy taken from esophageal mucosa and endoscope was withdrawn.  Complications:  None  Impression: Short esophagus with tortuous distal segment. Esophageal mucosa was coated with thin film of food debris. None critical ring at GE junction. 4 cm size sliding hiatal hernia with wide-open hiatus. Antral scar but no evidence of active peptic ulcer disease. Distal esophagus dilated with balloon to 19 mm disrupting ring at GE junction. Biopsy taken from mucosa of the esophagus.  Recommendations:  Increase pantoprazole to 40 mg by mouth twice a day. Patient advised not to take Naprosyn, Alka-Seltzer or similar medications. I would be contacting patient with biopsy results. If she remains with dysphagia will proceed with BPE prior to considering esophageal manometry and impedance study.  REHMAN,NAJEEB U  04/02/2014  8:13 AM  CC: Dr. Gar Ponto, MD & Dr. Rayne Du ref. provider found

## 2014-04-06 ENCOUNTER — Encounter (HOSPITAL_COMMUNITY): Payer: Self-pay | Admitting: Internal Medicine

## 2014-04-11 ENCOUNTER — Other Ambulatory Visit (HOSPITAL_COMMUNITY): Payer: Self-pay | Admitting: Internal Medicine

## 2014-04-11 NOTE — Telephone Encounter (Signed)
Pantoprazole 40 mg take 1 by mouth twice daily #180 3 refills was called to [facility].

## 2014-05-09 ENCOUNTER — Encounter (INDEPENDENT_AMBULATORY_CARE_PROVIDER_SITE_OTHER): Payer: Self-pay | Admitting: *Deleted

## 2014-06-14 ENCOUNTER — Encounter (INDEPENDENT_AMBULATORY_CARE_PROVIDER_SITE_OTHER): Payer: Self-pay

## 2014-06-26 DIAGNOSIS — L03031 Cellulitis of right toe: Secondary | ICD-10-CM | POA: Diagnosis not present

## 2014-06-26 DIAGNOSIS — L03032 Cellulitis of left toe: Secondary | ICD-10-CM | POA: Diagnosis not present

## 2014-06-26 DIAGNOSIS — E039 Hypothyroidism, unspecified: Secondary | ICD-10-CM | POA: Diagnosis not present

## 2014-06-26 DIAGNOSIS — R51 Headache: Secondary | ICD-10-CM | POA: Diagnosis not present

## 2014-06-26 DIAGNOSIS — E1165 Type 2 diabetes mellitus with hyperglycemia: Secondary | ICD-10-CM | POA: Diagnosis not present

## 2014-07-13 DIAGNOSIS — Z1389 Encounter for screening for other disorder: Secondary | ICD-10-CM | POA: Diagnosis not present

## 2014-07-13 DIAGNOSIS — I10 Essential (primary) hypertension: Secondary | ICD-10-CM | POA: Diagnosis not present

## 2014-07-13 DIAGNOSIS — E1165 Type 2 diabetes mellitus with hyperglycemia: Secondary | ICD-10-CM | POA: Diagnosis not present

## 2014-07-13 DIAGNOSIS — E039 Hypothyroidism, unspecified: Secondary | ICD-10-CM | POA: Diagnosis not present

## 2014-07-13 DIAGNOSIS — F331 Major depressive disorder, recurrent, moderate: Secondary | ICD-10-CM | POA: Diagnosis not present

## 2014-07-13 DIAGNOSIS — E782 Mixed hyperlipidemia: Secondary | ICD-10-CM | POA: Diagnosis not present

## 2014-07-19 DIAGNOSIS — M609 Myositis, unspecified: Secondary | ICD-10-CM | POA: Diagnosis not present

## 2014-07-19 DIAGNOSIS — E119 Type 2 diabetes mellitus without complications: Secondary | ICD-10-CM | POA: Diagnosis not present

## 2014-07-19 DIAGNOSIS — M545 Low back pain: Secondary | ICD-10-CM | POA: Diagnosis not present

## 2014-07-24 DIAGNOSIS — M609 Myositis, unspecified: Secondary | ICD-10-CM | POA: Diagnosis not present

## 2014-07-24 DIAGNOSIS — M545 Low back pain: Secondary | ICD-10-CM | POA: Diagnosis not present

## 2014-08-18 DIAGNOSIS — M609 Myositis, unspecified: Secondary | ICD-10-CM | POA: Diagnosis not present

## 2014-08-18 DIAGNOSIS — M545 Low back pain: Secondary | ICD-10-CM | POA: Diagnosis not present

## 2014-10-16 DIAGNOSIS — E1165 Type 2 diabetes mellitus with hyperglycemia: Secondary | ICD-10-CM | POA: Diagnosis not present

## 2014-10-16 DIAGNOSIS — E039 Hypothyroidism, unspecified: Secondary | ICD-10-CM | POA: Diagnosis not present

## 2014-10-16 DIAGNOSIS — K210 Gastro-esophageal reflux disease with esophagitis: Secondary | ICD-10-CM | POA: Diagnosis not present

## 2014-10-16 DIAGNOSIS — E782 Mixed hyperlipidemia: Secondary | ICD-10-CM | POA: Diagnosis not present

## 2014-10-16 DIAGNOSIS — I10 Essential (primary) hypertension: Secondary | ICD-10-CM | POA: Diagnosis not present

## 2014-10-23 DIAGNOSIS — F331 Major depressive disorder, recurrent, moderate: Secondary | ICD-10-CM | POA: Diagnosis not present

## 2014-10-23 DIAGNOSIS — M797 Fibromyalgia: Secondary | ICD-10-CM | POA: Diagnosis not present

## 2014-10-23 DIAGNOSIS — M545 Low back pain: Secondary | ICD-10-CM | POA: Diagnosis not present

## 2014-10-23 DIAGNOSIS — M609 Myositis, unspecified: Secondary | ICD-10-CM | POA: Diagnosis not present

## 2014-10-23 DIAGNOSIS — E782 Mixed hyperlipidemia: Secondary | ICD-10-CM | POA: Diagnosis not present

## 2014-10-23 DIAGNOSIS — S93612A Sprain of tarsal ligament of left foot, initial encounter: Secondary | ICD-10-CM | POA: Diagnosis not present

## 2014-10-23 DIAGNOSIS — I10 Essential (primary) hypertension: Secondary | ICD-10-CM | POA: Diagnosis not present

## 2014-10-23 DIAGNOSIS — E039 Hypothyroidism, unspecified: Secondary | ICD-10-CM | POA: Diagnosis not present

## 2014-10-23 DIAGNOSIS — E1165 Type 2 diabetes mellitus with hyperglycemia: Secondary | ICD-10-CM | POA: Diagnosis not present

## 2014-12-22 DIAGNOSIS — E119 Type 2 diabetes mellitus without complications: Secondary | ICD-10-CM | POA: Diagnosis not present

## 2014-12-22 DIAGNOSIS — Z9049 Acquired absence of other specified parts of digestive tract: Secondary | ICD-10-CM | POA: Diagnosis not present

## 2014-12-22 DIAGNOSIS — M549 Dorsalgia, unspecified: Secondary | ICD-10-CM | POA: Diagnosis not present

## 2014-12-22 DIAGNOSIS — M810 Age-related osteoporosis without current pathological fracture: Secondary | ICD-10-CM | POA: Diagnosis not present

## 2014-12-22 DIAGNOSIS — E780 Pure hypercholesterolemia: Secondary | ICD-10-CM | POA: Diagnosis not present

## 2014-12-22 DIAGNOSIS — Z888 Allergy status to other drugs, medicaments and biological substances status: Secondary | ICD-10-CM | POA: Diagnosis not present

## 2014-12-22 DIAGNOSIS — L308 Other specified dermatitis: Secondary | ICD-10-CM | POA: Diagnosis not present

## 2014-12-22 DIAGNOSIS — E039 Hypothyroidism, unspecified: Secondary | ICD-10-CM | POA: Diagnosis not present

## 2014-12-22 DIAGNOSIS — I10 Essential (primary) hypertension: Secondary | ICD-10-CM | POA: Diagnosis not present

## 2014-12-22 DIAGNOSIS — Z90710 Acquired absence of both cervix and uterus: Secondary | ICD-10-CM | POA: Diagnosis not present

## 2014-12-22 DIAGNOSIS — K760 Fatty (change of) liver, not elsewhere classified: Secondary | ICD-10-CM | POA: Diagnosis not present

## 2014-12-22 DIAGNOSIS — L508 Other urticaria: Secondary | ICD-10-CM | POA: Diagnosis not present

## 2014-12-22 DIAGNOSIS — K219 Gastro-esophageal reflux disease without esophagitis: Secondary | ICD-10-CM | POA: Diagnosis not present

## 2014-12-25 DIAGNOSIS — E039 Hypothyroidism, unspecified: Secondary | ICD-10-CM | POA: Diagnosis not present

## 2014-12-25 DIAGNOSIS — K210 Gastro-esophageal reflux disease with esophagitis: Secondary | ICD-10-CM | POA: Diagnosis not present

## 2014-12-25 DIAGNOSIS — E1165 Type 2 diabetes mellitus with hyperglycemia: Secondary | ICD-10-CM | POA: Diagnosis not present

## 2014-12-25 DIAGNOSIS — E782 Mixed hyperlipidemia: Secondary | ICD-10-CM | POA: Diagnosis not present

## 2014-12-26 DIAGNOSIS — R21 Rash and other nonspecific skin eruption: Secondary | ICD-10-CM | POA: Diagnosis not present

## 2015-01-01 DIAGNOSIS — E1165 Type 2 diabetes mellitus with hyperglycemia: Secondary | ICD-10-CM | POA: Diagnosis not present

## 2015-01-01 DIAGNOSIS — K219 Gastro-esophageal reflux disease without esophagitis: Secondary | ICD-10-CM | POA: Diagnosis not present

## 2015-01-01 DIAGNOSIS — I10 Essential (primary) hypertension: Secondary | ICD-10-CM | POA: Diagnosis not present

## 2015-01-01 DIAGNOSIS — M797 Fibromyalgia: Secondary | ICD-10-CM | POA: Diagnosis not present

## 2015-01-01 DIAGNOSIS — R21 Rash and other nonspecific skin eruption: Secondary | ICD-10-CM | POA: Diagnosis not present

## 2015-01-01 DIAGNOSIS — K580 Irritable bowel syndrome with diarrhea: Secondary | ICD-10-CM | POA: Diagnosis not present

## 2015-01-01 DIAGNOSIS — F331 Major depressive disorder, recurrent, moderate: Secondary | ICD-10-CM | POA: Diagnosis not present

## 2015-01-01 DIAGNOSIS — Z0001 Encounter for general adult medical examination with abnormal findings: Secondary | ICD-10-CM | POA: Diagnosis not present

## 2015-01-21 DIAGNOSIS — Z1231 Encounter for screening mammogram for malignant neoplasm of breast: Secondary | ICD-10-CM | POA: Diagnosis not present

## 2015-02-27 DIAGNOSIS — M545 Low back pain: Secondary | ICD-10-CM | POA: Diagnosis not present

## 2015-02-27 DIAGNOSIS — Z23 Encounter for immunization: Secondary | ICD-10-CM | POA: Diagnosis not present

## 2015-03-05 DIAGNOSIS — M545 Low back pain: Secondary | ICD-10-CM | POA: Diagnosis not present

## 2015-03-05 DIAGNOSIS — M5126 Other intervertebral disc displacement, lumbar region: Secondary | ICD-10-CM | POA: Diagnosis not present

## 2015-04-18 DIAGNOSIS — E782 Mixed hyperlipidemia: Secondary | ICD-10-CM | POA: Diagnosis not present

## 2015-04-18 DIAGNOSIS — E1165 Type 2 diabetes mellitus with hyperglycemia: Secondary | ICD-10-CM | POA: Diagnosis not present

## 2015-04-18 DIAGNOSIS — E039 Hypothyroidism, unspecified: Secondary | ICD-10-CM | POA: Diagnosis not present

## 2015-04-18 DIAGNOSIS — F331 Major depressive disorder, recurrent, moderate: Secondary | ICD-10-CM | POA: Diagnosis not present

## 2015-04-18 DIAGNOSIS — I10 Essential (primary) hypertension: Secondary | ICD-10-CM | POA: Diagnosis not present

## 2015-04-25 DIAGNOSIS — M24272 Disorder of ligament, left ankle: Secondary | ICD-10-CM | POA: Diagnosis not present

## 2015-04-25 DIAGNOSIS — R609 Edema, unspecified: Secondary | ICD-10-CM | POA: Diagnosis not present

## 2015-04-25 DIAGNOSIS — M25572 Pain in left ankle and joints of left foot: Secondary | ICD-10-CM | POA: Diagnosis not present

## 2015-05-02 ENCOUNTER — Other Ambulatory Visit (INDEPENDENT_AMBULATORY_CARE_PROVIDER_SITE_OTHER): Payer: Self-pay | Admitting: Internal Medicine

## 2015-05-23 DIAGNOSIS — M25572 Pain in left ankle and joints of left foot: Secondary | ICD-10-CM | POA: Diagnosis not present

## 2015-05-23 DIAGNOSIS — S93412D Sprain of calcaneofibular ligament of left ankle, subsequent encounter: Secondary | ICD-10-CM | POA: Diagnosis not present

## 2015-05-23 DIAGNOSIS — M5410 Radiculopathy, site unspecified: Secondary | ICD-10-CM | POA: Diagnosis not present

## 2015-05-27 DIAGNOSIS — J069 Acute upper respiratory infection, unspecified: Secondary | ICD-10-CM | POA: Diagnosis not present

## 2015-05-27 DIAGNOSIS — K5900 Constipation, unspecified: Secondary | ICD-10-CM | POA: Diagnosis not present

## 2015-05-27 DIAGNOSIS — R1084 Generalized abdominal pain: Secondary | ICD-10-CM | POA: Diagnosis not present

## 2015-05-27 DIAGNOSIS — B373 Candidiasis of vulva and vagina: Secondary | ICD-10-CM | POA: Diagnosis not present

## 2015-05-27 DIAGNOSIS — E1165 Type 2 diabetes mellitus with hyperglycemia: Secondary | ICD-10-CM | POA: Diagnosis not present

## 2015-05-28 DIAGNOSIS — K219 Gastro-esophageal reflux disease without esophagitis: Secondary | ICD-10-CM | POA: Diagnosis not present

## 2015-05-28 DIAGNOSIS — K5900 Constipation, unspecified: Secondary | ICD-10-CM | POA: Diagnosis not present

## 2015-05-28 DIAGNOSIS — R1013 Epigastric pain: Secondary | ICD-10-CM | POA: Diagnosis not present

## 2015-05-28 DIAGNOSIS — G8929 Other chronic pain: Secondary | ICD-10-CM | POA: Diagnosis not present

## 2015-05-28 DIAGNOSIS — Z79891 Long term (current) use of opiate analgesic: Secondary | ICD-10-CM | POA: Diagnosis not present

## 2015-05-28 DIAGNOSIS — R109 Unspecified abdominal pain: Secondary | ICD-10-CM | POA: Diagnosis not present

## 2015-05-28 DIAGNOSIS — Z7984 Long term (current) use of oral hypoglycemic drugs: Secondary | ICD-10-CM | POA: Diagnosis not present

## 2015-05-28 DIAGNOSIS — I10 Essential (primary) hypertension: Secondary | ICD-10-CM | POA: Diagnosis not present

## 2015-05-28 DIAGNOSIS — M545 Low back pain: Secondary | ICD-10-CM | POA: Diagnosis not present

## 2015-05-28 DIAGNOSIS — F329 Major depressive disorder, single episode, unspecified: Secondary | ICD-10-CM | POA: Diagnosis not present

## 2015-05-28 DIAGNOSIS — M797 Fibromyalgia: Secondary | ICD-10-CM | POA: Diagnosis not present

## 2015-05-28 DIAGNOSIS — E039 Hypothyroidism, unspecified: Secondary | ICD-10-CM | POA: Diagnosis not present

## 2015-05-28 DIAGNOSIS — Z79899 Other long term (current) drug therapy: Secondary | ICD-10-CM | POA: Diagnosis not present

## 2015-05-28 DIAGNOSIS — E119 Type 2 diabetes mellitus without complications: Secondary | ICD-10-CM | POA: Diagnosis not present

## 2015-05-29 DIAGNOSIS — Z79899 Other long term (current) drug therapy: Secondary | ICD-10-CM | POA: Diagnosis not present

## 2015-05-29 DIAGNOSIS — Z79891 Long term (current) use of opiate analgesic: Secondary | ICD-10-CM | POA: Diagnosis not present

## 2015-05-29 DIAGNOSIS — G8929 Other chronic pain: Secondary | ICD-10-CM | POA: Diagnosis not present

## 2015-05-29 DIAGNOSIS — F329 Major depressive disorder, single episode, unspecified: Secondary | ICD-10-CM | POA: Diagnosis not present

## 2015-05-29 DIAGNOSIS — K219 Gastro-esophageal reflux disease without esophagitis: Secondary | ICD-10-CM | POA: Diagnosis not present

## 2015-05-29 DIAGNOSIS — I10 Essential (primary) hypertension: Secondary | ICD-10-CM | POA: Diagnosis not present

## 2015-05-29 DIAGNOSIS — R109 Unspecified abdominal pain: Secondary | ICD-10-CM | POA: Diagnosis not present

## 2015-05-29 DIAGNOSIS — Z7984 Long term (current) use of oral hypoglycemic drugs: Secondary | ICD-10-CM | POA: Diagnosis not present

## 2015-05-29 DIAGNOSIS — E119 Type 2 diabetes mellitus without complications: Secondary | ICD-10-CM | POA: Diagnosis not present

## 2015-05-29 DIAGNOSIS — E039 Hypothyroidism, unspecified: Secondary | ICD-10-CM | POA: Diagnosis not present

## 2015-05-29 DIAGNOSIS — M545 Low back pain: Secondary | ICD-10-CM | POA: Diagnosis not present

## 2015-05-29 DIAGNOSIS — K5900 Constipation, unspecified: Secondary | ICD-10-CM | POA: Diagnosis not present

## 2015-05-29 DIAGNOSIS — M797 Fibromyalgia: Secondary | ICD-10-CM | POA: Diagnosis not present

## 2015-05-30 DIAGNOSIS — M797 Fibromyalgia: Secondary | ICD-10-CM | POA: Diagnosis not present

## 2015-05-30 DIAGNOSIS — E119 Type 2 diabetes mellitus without complications: Secondary | ICD-10-CM | POA: Diagnosis not present

## 2015-05-30 DIAGNOSIS — R109 Unspecified abdominal pain: Secondary | ICD-10-CM | POA: Diagnosis not present

## 2015-05-30 DIAGNOSIS — K5900 Constipation, unspecified: Secondary | ICD-10-CM | POA: Diagnosis not present

## 2015-05-30 DIAGNOSIS — E039 Hypothyroidism, unspecified: Secondary | ICD-10-CM | POA: Diagnosis not present

## 2015-05-30 DIAGNOSIS — I10 Essential (primary) hypertension: Secondary | ICD-10-CM | POA: Diagnosis not present

## 2015-05-30 DIAGNOSIS — F329 Major depressive disorder, single episode, unspecified: Secondary | ICD-10-CM | POA: Diagnosis not present

## 2015-05-30 DIAGNOSIS — M545 Low back pain: Secondary | ICD-10-CM | POA: Diagnosis not present

## 2015-05-30 DIAGNOSIS — Z7984 Long term (current) use of oral hypoglycemic drugs: Secondary | ICD-10-CM | POA: Diagnosis not present

## 2015-05-30 DIAGNOSIS — Z79891 Long term (current) use of opiate analgesic: Secondary | ICD-10-CM | POA: Diagnosis not present

## 2015-05-30 DIAGNOSIS — K219 Gastro-esophageal reflux disease without esophagitis: Secondary | ICD-10-CM | POA: Diagnosis not present

## 2015-05-30 DIAGNOSIS — Z79899 Other long term (current) drug therapy: Secondary | ICD-10-CM | POA: Diagnosis not present

## 2015-05-30 DIAGNOSIS — G8929 Other chronic pain: Secondary | ICD-10-CM | POA: Diagnosis not present

## 2015-07-01 DIAGNOSIS — S93412D Sprain of calcaneofibular ligament of left ankle, subsequent encounter: Secondary | ICD-10-CM | POA: Diagnosis not present

## 2015-07-22 DIAGNOSIS — E039 Hypothyroidism, unspecified: Secondary | ICD-10-CM | POA: Diagnosis not present

## 2015-07-22 DIAGNOSIS — E1165 Type 2 diabetes mellitus with hyperglycemia: Secondary | ICD-10-CM | POA: Diagnosis not present

## 2015-07-22 DIAGNOSIS — I10 Essential (primary) hypertension: Secondary | ICD-10-CM | POA: Diagnosis not present

## 2015-07-22 DIAGNOSIS — E782 Mixed hyperlipidemia: Secondary | ICD-10-CM | POA: Diagnosis not present

## 2015-07-22 DIAGNOSIS — R300 Dysuria: Secondary | ICD-10-CM | POA: Diagnosis not present

## 2015-07-22 DIAGNOSIS — K580 Irritable bowel syndrome with diarrhea: Secondary | ICD-10-CM | POA: Diagnosis not present

## 2015-08-13 DIAGNOSIS — E11319 Type 2 diabetes mellitus with unspecified diabetic retinopathy without macular edema: Secondary | ICD-10-CM | POA: Diagnosis not present

## 2015-08-21 DIAGNOSIS — R300 Dysuria: Secondary | ICD-10-CM | POA: Diagnosis not present

## 2015-08-21 DIAGNOSIS — Z0000 Encounter for general adult medical examination without abnormal findings: Secondary | ICD-10-CM | POA: Diagnosis not present

## 2015-09-13 DIAGNOSIS — M545 Low back pain: Secondary | ICD-10-CM | POA: Diagnosis not present

## 2015-09-13 DIAGNOSIS — E039 Hypothyroidism, unspecified: Secondary | ICD-10-CM | POA: Diagnosis not present

## 2015-09-13 DIAGNOSIS — M797 Fibromyalgia: Secondary | ICD-10-CM | POA: Diagnosis not present

## 2015-10-15 DIAGNOSIS — E782 Mixed hyperlipidemia: Secondary | ICD-10-CM | POA: Diagnosis not present

## 2015-10-15 DIAGNOSIS — E039 Hypothyroidism, unspecified: Secondary | ICD-10-CM | POA: Diagnosis not present

## 2015-10-15 DIAGNOSIS — I10 Essential (primary) hypertension: Secondary | ICD-10-CM | POA: Diagnosis not present

## 2015-10-15 DIAGNOSIS — E1165 Type 2 diabetes mellitus with hyperglycemia: Secondary | ICD-10-CM | POA: Diagnosis not present

## 2015-10-15 DIAGNOSIS — K219 Gastro-esophageal reflux disease without esophagitis: Secondary | ICD-10-CM | POA: Diagnosis not present

## 2015-10-16 DIAGNOSIS — E039 Hypothyroidism, unspecified: Secondary | ICD-10-CM | POA: Diagnosis not present

## 2015-10-16 DIAGNOSIS — M797 Fibromyalgia: Secondary | ICD-10-CM | POA: Diagnosis not present

## 2015-10-16 DIAGNOSIS — M545 Low back pain: Secondary | ICD-10-CM | POA: Diagnosis not present

## 2015-10-16 DIAGNOSIS — E1165 Type 2 diabetes mellitus with hyperglycemia: Secondary | ICD-10-CM | POA: Diagnosis not present

## 2015-12-27 DIAGNOSIS — E039 Hypothyroidism, unspecified: Secondary | ICD-10-CM | POA: Diagnosis not present

## 2015-12-27 DIAGNOSIS — M545 Low back pain: Secondary | ICD-10-CM | POA: Diagnosis not present

## 2015-12-27 DIAGNOSIS — M797 Fibromyalgia: Secondary | ICD-10-CM | POA: Diagnosis not present

## 2015-12-27 DIAGNOSIS — E1165 Type 2 diabetes mellitus with hyperglycemia: Secondary | ICD-10-CM | POA: Diagnosis not present

## 2016-01-28 DIAGNOSIS — Z1231 Encounter for screening mammogram for malignant neoplasm of breast: Secondary | ICD-10-CM | POA: Diagnosis not present

## 2016-02-25 DIAGNOSIS — J300 Vasomotor rhinitis: Secondary | ICD-10-CM | POA: Diagnosis not present

## 2016-02-25 DIAGNOSIS — M545 Low back pain: Secondary | ICD-10-CM | POA: Diagnosis not present

## 2016-02-25 DIAGNOSIS — M7062 Trochanteric bursitis, left hip: Secondary | ICD-10-CM | POA: Diagnosis not present

## 2016-02-25 DIAGNOSIS — J301 Allergic rhinitis due to pollen: Secondary | ICD-10-CM | POA: Diagnosis not present

## 2016-02-25 DIAGNOSIS — Z23 Encounter for immunization: Secondary | ICD-10-CM | POA: Diagnosis not present

## 2016-03-10 DIAGNOSIS — G43009 Migraine without aura, not intractable, without status migrainosus: Secondary | ICD-10-CM | POA: Diagnosis not present

## 2016-03-10 DIAGNOSIS — R300 Dysuria: Secondary | ICD-10-CM | POA: Diagnosis not present

## 2016-03-12 DIAGNOSIS — Z1212 Encounter for screening for malignant neoplasm of rectum: Secondary | ICD-10-CM | POA: Diagnosis not present

## 2016-03-12 DIAGNOSIS — E039 Hypothyroidism, unspecified: Secondary | ICD-10-CM | POA: Diagnosis not present

## 2016-03-12 DIAGNOSIS — E1165 Type 2 diabetes mellitus with hyperglycemia: Secondary | ICD-10-CM | POA: Diagnosis not present

## 2016-03-12 DIAGNOSIS — M797 Fibromyalgia: Secondary | ICD-10-CM | POA: Diagnosis not present

## 2016-03-12 DIAGNOSIS — M545 Low back pain: Secondary | ICD-10-CM | POA: Diagnosis not present

## 2016-07-03 DIAGNOSIS — K210 Gastro-esophageal reflux disease with esophagitis: Secondary | ICD-10-CM | POA: Diagnosis not present

## 2016-07-03 DIAGNOSIS — E782 Mixed hyperlipidemia: Secondary | ICD-10-CM | POA: Diagnosis not present

## 2016-07-03 DIAGNOSIS — I10 Essential (primary) hypertension: Secondary | ICD-10-CM | POA: Diagnosis not present

## 2016-07-03 DIAGNOSIS — E1165 Type 2 diabetes mellitus with hyperglycemia: Secondary | ICD-10-CM | POA: Diagnosis not present

## 2016-07-03 DIAGNOSIS — E039 Hypothyroidism, unspecified: Secondary | ICD-10-CM | POA: Diagnosis not present

## 2016-07-08 DIAGNOSIS — M797 Fibromyalgia: Secondary | ICD-10-CM | POA: Diagnosis not present

## 2016-07-08 DIAGNOSIS — M545 Low back pain: Secondary | ICD-10-CM | POA: Diagnosis not present

## 2016-07-08 DIAGNOSIS — E1165 Type 2 diabetes mellitus with hyperglycemia: Secondary | ICD-10-CM | POA: Diagnosis not present

## 2016-07-08 DIAGNOSIS — Z0001 Encounter for general adult medical examination with abnormal findings: Secondary | ICD-10-CM | POA: Diagnosis not present

## 2016-07-08 DIAGNOSIS — R300 Dysuria: Secondary | ICD-10-CM | POA: Diagnosis not present

## 2016-07-08 DIAGNOSIS — E039 Hypothyroidism, unspecified: Secondary | ICD-10-CM | POA: Diagnosis not present

## 2016-07-10 DIAGNOSIS — K760 Fatty (change of) liver, not elsewhere classified: Secondary | ICD-10-CM | POA: Diagnosis not present

## 2016-07-10 DIAGNOSIS — R1012 Left upper quadrant pain: Secondary | ICD-10-CM | POA: Diagnosis not present

## 2016-07-10 DIAGNOSIS — N289 Disorder of kidney and ureter, unspecified: Secondary | ICD-10-CM | POA: Diagnosis not present

## 2016-07-10 DIAGNOSIS — N281 Cyst of kidney, acquired: Secondary | ICD-10-CM | POA: Diagnosis not present

## 2016-07-10 DIAGNOSIS — Z9049 Acquired absence of other specified parts of digestive tract: Secondary | ICD-10-CM | POA: Diagnosis not present

## 2016-09-29 DIAGNOSIS — E1165 Type 2 diabetes mellitus with hyperglycemia: Secondary | ICD-10-CM | POA: Diagnosis not present

## 2016-09-29 DIAGNOSIS — M545 Low back pain: Secondary | ICD-10-CM | POA: Diagnosis not present

## 2016-09-29 DIAGNOSIS — M797 Fibromyalgia: Secondary | ICD-10-CM | POA: Diagnosis not present

## 2016-09-29 DIAGNOSIS — E039 Hypothyroidism, unspecified: Secondary | ICD-10-CM | POA: Diagnosis not present

## 2016-11-24 DIAGNOSIS — H40053 Ocular hypertension, bilateral: Secondary | ICD-10-CM | POA: Diagnosis not present

## 2016-12-19 DIAGNOSIS — I10 Essential (primary) hypertension: Secondary | ICD-10-CM | POA: Diagnosis not present

## 2016-12-19 DIAGNOSIS — M810 Age-related osteoporosis without current pathological fracture: Secondary | ICD-10-CM | POA: Diagnosis not present

## 2016-12-19 DIAGNOSIS — R1084 Generalized abdominal pain: Secondary | ICD-10-CM | POA: Diagnosis not present

## 2016-12-19 DIAGNOSIS — M545 Low back pain: Secondary | ICD-10-CM | POA: Diagnosis not present

## 2016-12-19 DIAGNOSIS — E039 Hypothyroidism, unspecified: Secondary | ICD-10-CM | POA: Diagnosis not present

## 2016-12-19 DIAGNOSIS — K219 Gastro-esophageal reflux disease without esophagitis: Secondary | ICD-10-CM | POA: Diagnosis not present

## 2016-12-19 DIAGNOSIS — Z79899 Other long term (current) drug therapy: Secondary | ICD-10-CM | POA: Diagnosis not present

## 2016-12-19 DIAGNOSIS — E119 Type 2 diabetes mellitus without complications: Secondary | ICD-10-CM | POA: Diagnosis not present

## 2016-12-19 DIAGNOSIS — E7800 Pure hypercholesterolemia, unspecified: Secondary | ICD-10-CM | POA: Diagnosis not present

## 2016-12-19 DIAGNOSIS — R109 Unspecified abdominal pain: Secondary | ICD-10-CM | POA: Diagnosis not present

## 2016-12-19 DIAGNOSIS — K5901 Slow transit constipation: Secondary | ICD-10-CM | POA: Diagnosis not present

## 2016-12-19 DIAGNOSIS — Z79891 Long term (current) use of opiate analgesic: Secondary | ICD-10-CM | POA: Diagnosis not present

## 2016-12-21 DIAGNOSIS — E1165 Type 2 diabetes mellitus with hyperglycemia: Secondary | ICD-10-CM | POA: Diagnosis not present

## 2016-12-21 DIAGNOSIS — I10 Essential (primary) hypertension: Secondary | ICD-10-CM | POA: Diagnosis not present

## 2016-12-21 DIAGNOSIS — K219 Gastro-esophageal reflux disease without esophagitis: Secondary | ICD-10-CM | POA: Diagnosis not present

## 2016-12-21 DIAGNOSIS — M797 Fibromyalgia: Secondary | ICD-10-CM | POA: Diagnosis not present

## 2016-12-21 DIAGNOSIS — M545 Low back pain: Secondary | ICD-10-CM | POA: Diagnosis not present

## 2016-12-25 DIAGNOSIS — T50905A Adverse effect of unspecified drugs, medicaments and biological substances, initial encounter: Secondary | ICD-10-CM | POA: Diagnosis not present

## 2016-12-25 DIAGNOSIS — T887XXA Unspecified adverse effect of drug or medicament, initial encounter: Secondary | ICD-10-CM | POA: Diagnosis not present

## 2016-12-25 DIAGNOSIS — K5903 Drug induced constipation: Secondary | ICD-10-CM | POA: Diagnosis not present

## 2016-12-25 DIAGNOSIS — R109 Unspecified abdominal pain: Secondary | ICD-10-CM | POA: Diagnosis not present

## 2016-12-25 DIAGNOSIS — Z79899 Other long term (current) drug therapy: Secondary | ICD-10-CM | POA: Diagnosis not present

## 2016-12-25 DIAGNOSIS — I10 Essential (primary) hypertension: Secondary | ICD-10-CM | POA: Diagnosis not present

## 2016-12-25 DIAGNOSIS — E119 Type 2 diabetes mellitus without complications: Secondary | ICD-10-CM | POA: Diagnosis not present

## 2016-12-25 DIAGNOSIS — E7800 Pure hypercholesterolemia, unspecified: Secondary | ICD-10-CM | POA: Diagnosis not present

## 2016-12-25 DIAGNOSIS — M810 Age-related osteoporosis without current pathological fracture: Secondary | ICD-10-CM | POA: Diagnosis not present

## 2016-12-25 DIAGNOSIS — K219 Gastro-esophageal reflux disease without esophagitis: Secondary | ICD-10-CM | POA: Diagnosis not present

## 2016-12-25 DIAGNOSIS — E039 Hypothyroidism, unspecified: Secondary | ICD-10-CM | POA: Diagnosis not present

## 2016-12-25 DIAGNOSIS — Z79891 Long term (current) use of opiate analgesic: Secondary | ICD-10-CM | POA: Diagnosis not present

## 2016-12-25 DIAGNOSIS — T402X5A Adverse effect of other opioids, initial encounter: Secondary | ICD-10-CM | POA: Diagnosis not present

## 2016-12-25 DIAGNOSIS — R1084 Generalized abdominal pain: Secondary | ICD-10-CM | POA: Diagnosis not present

## 2016-12-25 DIAGNOSIS — L270 Generalized skin eruption due to drugs and medicaments taken internally: Secondary | ICD-10-CM | POA: Diagnosis not present

## 2017-02-02 DIAGNOSIS — Z1231 Encounter for screening mammogram for malignant neoplasm of breast: Secondary | ICD-10-CM | POA: Diagnosis not present

## 2017-02-17 DIAGNOSIS — M797 Fibromyalgia: Secondary | ICD-10-CM | POA: Diagnosis not present

## 2017-02-17 DIAGNOSIS — I10 Essential (primary) hypertension: Secondary | ICD-10-CM | POA: Diagnosis not present

## 2017-02-17 DIAGNOSIS — K219 Gastro-esophageal reflux disease without esophagitis: Secondary | ICD-10-CM | POA: Diagnosis not present

## 2017-02-17 DIAGNOSIS — E1165 Type 2 diabetes mellitus with hyperglycemia: Secondary | ICD-10-CM | POA: Diagnosis not present

## 2017-02-17 DIAGNOSIS — M545 Low back pain: Secondary | ICD-10-CM | POA: Diagnosis not present

## 2017-02-17 DIAGNOSIS — E039 Hypothyroidism, unspecified: Secondary | ICD-10-CM | POA: Diagnosis not present

## 2017-04-07 DIAGNOSIS — E113292 Type 2 diabetes mellitus with mild nonproliferative diabetic retinopathy without macular edema, left eye: Secondary | ICD-10-CM | POA: Diagnosis not present

## 2017-04-07 DIAGNOSIS — H2513 Age-related nuclear cataract, bilateral: Secondary | ICD-10-CM | POA: Diagnosis not present

## 2017-04-07 DIAGNOSIS — H25013 Cortical age-related cataract, bilateral: Secondary | ICD-10-CM | POA: Diagnosis not present

## 2017-04-07 DIAGNOSIS — H40013 Open angle with borderline findings, low risk, bilateral: Secondary | ICD-10-CM | POA: Diagnosis not present

## 2017-04-07 DIAGNOSIS — H2511 Age-related nuclear cataract, right eye: Secondary | ICD-10-CM | POA: Diagnosis not present

## 2017-04-13 ENCOUNTER — Other Ambulatory Visit: Payer: Self-pay

## 2017-04-20 DIAGNOSIS — H2511 Age-related nuclear cataract, right eye: Secondary | ICD-10-CM | POA: Diagnosis not present

## 2017-04-20 DIAGNOSIS — H25811 Combined forms of age-related cataract, right eye: Secondary | ICD-10-CM | POA: Diagnosis not present

## 2017-05-07 DIAGNOSIS — I10 Essential (primary) hypertension: Secondary | ICD-10-CM | POA: Diagnosis not present

## 2017-05-07 DIAGNOSIS — E782 Mixed hyperlipidemia: Secondary | ICD-10-CM | POA: Diagnosis not present

## 2017-05-07 DIAGNOSIS — E039 Hypothyroidism, unspecified: Secondary | ICD-10-CM | POA: Diagnosis not present

## 2017-05-07 DIAGNOSIS — E1165 Type 2 diabetes mellitus with hyperglycemia: Secondary | ICD-10-CM | POA: Diagnosis not present

## 2017-05-10 DIAGNOSIS — M545 Low back pain: Secondary | ICD-10-CM | POA: Diagnosis not present

## 2017-05-10 DIAGNOSIS — M797 Fibromyalgia: Secondary | ICD-10-CM | POA: Diagnosis not present

## 2017-05-10 DIAGNOSIS — E1165 Type 2 diabetes mellitus with hyperglycemia: Secondary | ICD-10-CM | POA: Diagnosis not present

## 2017-05-10 DIAGNOSIS — E039 Hypothyroidism, unspecified: Secondary | ICD-10-CM | POA: Diagnosis not present

## 2017-05-10 DIAGNOSIS — I10 Essential (primary) hypertension: Secondary | ICD-10-CM | POA: Diagnosis not present

## 2017-06-17 DIAGNOSIS — M810 Age-related osteoporosis without current pathological fracture: Secondary | ICD-10-CM | POA: Diagnosis not present

## 2017-06-17 DIAGNOSIS — M85851 Other specified disorders of bone density and structure, right thigh: Secondary | ICD-10-CM | POA: Diagnosis not present

## 2017-06-17 DIAGNOSIS — M8589 Other specified disorders of bone density and structure, multiple sites: Secondary | ICD-10-CM | POA: Diagnosis not present

## 2017-07-09 DIAGNOSIS — E1165 Type 2 diabetes mellitus with hyperglycemia: Secondary | ICD-10-CM | POA: Diagnosis not present

## 2017-07-09 DIAGNOSIS — M545 Low back pain: Secondary | ICD-10-CM | POA: Diagnosis not present

## 2017-07-09 DIAGNOSIS — E039 Hypothyroidism, unspecified: Secondary | ICD-10-CM | POA: Diagnosis not present

## 2017-07-09 DIAGNOSIS — M797 Fibromyalgia: Secondary | ICD-10-CM | POA: Diagnosis not present

## 2017-07-09 DIAGNOSIS — Z1212 Encounter for screening for malignant neoplasm of rectum: Secondary | ICD-10-CM | POA: Diagnosis not present

## 2017-07-09 DIAGNOSIS — Z23 Encounter for immunization: Secondary | ICD-10-CM | POA: Diagnosis not present

## 2017-07-09 DIAGNOSIS — Z0001 Encounter for general adult medical examination with abnormal findings: Secondary | ICD-10-CM | POA: Diagnosis not present

## 2017-09-23 DIAGNOSIS — H04123 Dry eye syndrome of bilateral lacrimal glands: Secondary | ICD-10-CM | POA: Diagnosis not present

## 2017-11-09 DIAGNOSIS — E039 Hypothyroidism, unspecified: Secondary | ICD-10-CM | POA: Diagnosis not present

## 2017-11-09 DIAGNOSIS — I10 Essential (primary) hypertension: Secondary | ICD-10-CM | POA: Diagnosis not present

## 2017-11-09 DIAGNOSIS — K210 Gastro-esophageal reflux disease with esophagitis: Secondary | ICD-10-CM | POA: Diagnosis not present

## 2017-11-09 DIAGNOSIS — Z9189 Other specified personal risk factors, not elsewhere classified: Secondary | ICD-10-CM | POA: Diagnosis not present

## 2017-11-09 DIAGNOSIS — E1165 Type 2 diabetes mellitus with hyperglycemia: Secondary | ICD-10-CM | POA: Diagnosis not present

## 2017-11-11 DIAGNOSIS — E1165 Type 2 diabetes mellitus with hyperglycemia: Secondary | ICD-10-CM | POA: Diagnosis not present

## 2017-11-11 DIAGNOSIS — M545 Low back pain: Secondary | ICD-10-CM | POA: Diagnosis not present

## 2017-11-11 DIAGNOSIS — Z1389 Encounter for screening for other disorder: Secondary | ICD-10-CM | POA: Diagnosis not present

## 2017-11-11 DIAGNOSIS — M797 Fibromyalgia: Secondary | ICD-10-CM | POA: Diagnosis not present

## 2017-11-11 DIAGNOSIS — I10 Essential (primary) hypertension: Secondary | ICD-10-CM | POA: Diagnosis not present

## 2017-11-11 DIAGNOSIS — E039 Hypothyroidism, unspecified: Secondary | ICD-10-CM | POA: Diagnosis not present

## 2017-12-24 DIAGNOSIS — N10 Acute tubulo-interstitial nephritis: Secondary | ICD-10-CM | POA: Diagnosis not present

## 2017-12-24 DIAGNOSIS — R300 Dysuria: Secondary | ICD-10-CM | POA: Diagnosis not present

## 2017-12-24 DIAGNOSIS — R509 Fever, unspecified: Secondary | ICD-10-CM | POA: Diagnosis not present

## 2018-02-08 DIAGNOSIS — Z1389 Encounter for screening for other disorder: Secondary | ICD-10-CM | POA: Diagnosis not present

## 2018-02-08 DIAGNOSIS — E1165 Type 2 diabetes mellitus with hyperglycemia: Secondary | ICD-10-CM | POA: Diagnosis not present

## 2018-02-08 DIAGNOSIS — I10 Essential (primary) hypertension: Secondary | ICD-10-CM | POA: Diagnosis not present

## 2018-02-08 DIAGNOSIS — M797 Fibromyalgia: Secondary | ICD-10-CM | POA: Diagnosis not present

## 2018-02-08 DIAGNOSIS — E039 Hypothyroidism, unspecified: Secondary | ICD-10-CM | POA: Diagnosis not present

## 2018-02-08 DIAGNOSIS — M545 Low back pain: Secondary | ICD-10-CM | POA: Diagnosis not present

## 2018-03-01 DIAGNOSIS — Z9049 Acquired absence of other specified parts of digestive tract: Secondary | ICD-10-CM | POA: Diagnosis not present

## 2018-03-01 DIAGNOSIS — R1084 Generalized abdominal pain: Secondary | ICD-10-CM | POA: Diagnosis not present

## 2018-03-01 DIAGNOSIS — N281 Cyst of kidney, acquired: Secondary | ICD-10-CM | POA: Diagnosis not present

## 2018-05-13 DIAGNOSIS — E1165 Type 2 diabetes mellitus with hyperglycemia: Secondary | ICD-10-CM | POA: Diagnosis not present

## 2018-05-13 DIAGNOSIS — I10 Essential (primary) hypertension: Secondary | ICD-10-CM | POA: Diagnosis not present

## 2018-05-13 DIAGNOSIS — M797 Fibromyalgia: Secondary | ICD-10-CM | POA: Diagnosis not present

## 2018-05-13 DIAGNOSIS — E782 Mixed hyperlipidemia: Secondary | ICD-10-CM | POA: Diagnosis not present

## 2018-05-13 DIAGNOSIS — K210 Gastro-esophageal reflux disease with esophagitis: Secondary | ICD-10-CM | POA: Diagnosis not present

## 2018-05-17 DIAGNOSIS — E039 Hypothyroidism, unspecified: Secondary | ICD-10-CM | POA: Diagnosis not present

## 2018-05-17 DIAGNOSIS — K219 Gastro-esophageal reflux disease without esophagitis: Secondary | ICD-10-CM | POA: Diagnosis not present

## 2018-05-17 DIAGNOSIS — M797 Fibromyalgia: Secondary | ICD-10-CM | POA: Diagnosis not present

## 2018-05-17 DIAGNOSIS — I10 Essential (primary) hypertension: Secondary | ICD-10-CM | POA: Diagnosis not present

## 2018-05-17 DIAGNOSIS — M545 Low back pain: Secondary | ICD-10-CM | POA: Diagnosis not present

## 2018-05-17 DIAGNOSIS — E1165 Type 2 diabetes mellitus with hyperglycemia: Secondary | ICD-10-CM | POA: Diagnosis not present

## 2018-05-26 ENCOUNTER — Other Ambulatory Visit: Payer: Self-pay

## 2018-05-26 NOTE — Patient Outreach (Signed)
[place] [facility]) Care Management  05/26/2018  MARDA BREIDENBACH 1950-07-04 [id number]   Medication Adherence call to Mrs. Erika Ward spoke with patient she is due on Metformin 500 mg patient ask if we can call [facility] an order this medication. Patient will be receiving it from [facility] with in 7-10 days. [name] is showing past due under [place].   [place] Management Direct Dial [phone]  Fax [phone] [name]

## 2018-06-02 ENCOUNTER — Ambulatory Visit (INDEPENDENT_AMBULATORY_CARE_PROVIDER_SITE_OTHER): Payer: Self-pay | Admitting: Internal Medicine

## 2018-06-20 ENCOUNTER — Ambulatory Visit (INDEPENDENT_AMBULATORY_CARE_PROVIDER_SITE_OTHER): Payer: Self-pay | Admitting: Internal Medicine

## 2018-06-21 ENCOUNTER — Other Ambulatory Visit: Payer: Self-pay | Admitting: Registered Nurse

## 2018-06-21 ENCOUNTER — Ambulatory Visit (INDEPENDENT_AMBULATORY_CARE_PROVIDER_SITE_OTHER): Payer: Medicare Other | Admitting: Internal Medicine

## 2018-06-21 ENCOUNTER — Encounter (INDEPENDENT_AMBULATORY_CARE_PROVIDER_SITE_OTHER): Payer: Self-pay | Admitting: *Deleted

## 2018-06-21 ENCOUNTER — Encounter (INDEPENDENT_AMBULATORY_CARE_PROVIDER_SITE_OTHER): Payer: Self-pay | Admitting: Internal Medicine

## 2018-06-21 VITALS — BP 150/80 | HR 72 | Temp 97.7°F | Ht 60.0 in | Wt 127.4 lb

## 2018-06-21 DIAGNOSIS — R1310 Dysphagia, unspecified: Secondary | ICD-10-CM | POA: Diagnosis not present

## 2018-06-21 DIAGNOSIS — R1319 Other dysphagia: Secondary | ICD-10-CM

## 2018-06-21 NOTE — Patient Instructions (Signed)
DG esophagram.   

## 2018-06-21 NOTE — Progress Notes (Signed)
Past Medical History:  Diagnosis Date  . Chronic pain   . Diabetes    Type 2 x 1 yr  . Dysphagia   . GERD (gastroesophageal reflux disease)   . High cholesterol   . Hypertension    1 yr  . Hypothyroid     Past Surgical History:  Procedure Laterality Date  . CHOLECYSTECTOMY    . ESOPHAGOGASTRODUODENOSCOPY N/A 04/02/2014   Procedure: ESOPHAGOGASTRODUODENOSCOPY (EGD);  Surgeon: Rogene Houston, MD;  [facility];  Service: Endoscopy;  Laterality: N/A;  730  . ESOPHAGOGASTRODUODENOSCOPY (EGD) WITH ESOPHAGEAL DILATION  05/18/2012   Procedure: ESOPHAGOGASTRODUODENOSCOPY (EGD) WITH ESOPHAGEAL DILATION;  Surgeon: Rogene Houston, MD;  [facility];  Service: Endoscopy;  Laterality: N/A;  320  . MALONEY DILATION N/A 04/02/2014   Procedure: Venia Minks DILATION;  Surgeon: Rogene Houston, MD;  [facility];  Service: Endoscopy;  Laterality: N/A;  . Nissan Wrap    . PARTIAL HYSTERECTOMY    . Repair [facility] bowel at [facility]      Allergies  Allergen Reactions  . Reglan [Metoclopramide] Anxiety  . Crestor [Rosuvastatin]     Aching in legs  . Norvasc [Amlodipine Besylate] Other (See Comments)    Swells legs  . Prozac [Fluoxetine Hcl]   . Valsartan Other (See Comments)    Dehydrate,cramp  . Zoloft [Sertraline Hcl] Other (See Comments)    Shaking   . Zofran [Ondansetron Hcl] Rash    Current Outpatient Medications on File Prior to Visit  Medication Sig Dispense Refill  . ALPRAZolam (XANAX) 0.5 MG tablet Take 0.5 mg by mouth daily.    . bisacodyl (DULCOLAX) 5 MG EC tablet Take 5 mg by mouth daily as needed for moderate constipation.    . cyclobenzaprine (FLEXERIL) 10 MG tablet Take 10 mg by mouth 3 (three) times daily as needed. Muscle Spasms    . diphenhydrAMINE (BENADRYL) 25 MG tablet Take 25 mg by mouth every 6 (six) hours as needed. Allergies    . fluticasone (FLONASE) 50 MCG/ACT nasal spray Place 2 sprays into the nose daily.    Marland Kitchen  levothyroxine (SYNTHROID, LEVOTHROID) 75 MCG tablet Take 75 mcg by mouth daily before breakfast.    . metFORMIN (GLUCOPHAGE) 500 MG tablet Take 500 mg by mouth daily with breakfast.    . methadone (DOLOPHINE) 5 MG tablet Take 5 mg by mouth every 8 (eight) hours as needed. Pain    . Multiple Vitamin (MULTIVITAMIN WITH MINERALS) TABS Take 1 tablet by mouth daily.    . pantoprazole (PROTONIX) 40 MG tablet TAKE 1 TABLET BY MOUTH TWICE DAILY 180 tablet 3  . promethazine (PHENERGAN) 25 MG tablet Take 25 mg by mouth every 6 (six) hours as needed. Nausea and Vomiting     No current facility-administered medications on file prior to visit.

## 2018-06-21 NOTE — Progress Notes (Signed)
Subjective:    Patient ID: Erika Ward, female    DOB: 1950/07/01, 68 y.o.   MRN: [id number]  HPI Referred by Dr. Quillian Quince for GERD/Dysphagia.   Dysphagia for several months. Sometimes water will not go down. Foods feel like the lodge. Tuna, nuts, lettuce, and meats are hard to swallow. Her appetite is good. No weight loss.  Has a BM every couple of days. Leans toward constipation and takes Miralax as needed, Amitzia, and stool softener.  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       Last EGDED was in 2015 (Hx of PUD, solid food dysphagia).  Impression: Short esophagus with tortuous distal segment. Esophageal mucosa was coated with thin film of food debris. None critical ring at GE junction. 4 cm size sliding hiatal hernia with wide-open hiatus. Antral scar but no evidence of active peptic ulcer disease. Distal esophagus dilated with balloon to 19 mm disrupting ring at GE junction. Biopsy taken from mucosa of the esophagus.   Review of Systems Past Medical History:  Diagnosis Date  . Chronic pain   . Diabetes ([place])    Type 2 x 1 yr  . Dysphagia   . GERD (gastroesophageal reflux disease)   . High cholesterol   . Hypertension    1 yr  . Hypothyroid     Past Surgical History:  Procedure Laterality Date  . CHOLECYSTECTOMY    . ESOPHAGOGASTRODUODENOSCOPY N/A 04/02/2014   Procedure: ESOPHAGOGASTRODUODENOSCOPY (EGD);  Surgeon: Rogene Houston, MD;  [facility];  Service: Endoscopy;  Laterality: N/A;  730  . ESOPHAGOGASTRODUODENOSCOPY (EGD) WITH ESOPHAGEAL DILATION  05/18/2012   Procedure: ESOPHAGOGASTRODUODENOSCOPY (EGD) WITH ESOPHAGEAL DILATION;  Surgeon: Rogene Houston, MD;  [facility];  Service: Endoscopy;  Laterality: N/A;  320  . MALONEY DILATION N/A 04/02/2014   Procedure: Venia Minks DILATION;  Surgeon: Rogene Houston, MD;  [facility];  Service: Endoscopy;  Laterality: N/A;  . Nissan Wrap    .  PARTIAL HYSTERECTOMY    . [facility]air [facility] bowel at [facility]      Allergies  Allergen Reactions  . Reglan [Metoclopramide] Anxiety  . Crestor [Rosuvastatin]     Aching in legs  . Norvasc [Amlodipine Besylate] Other (See Comments)    Swells legs  . Prozac [Fluoxetine Hcl]   . Valsartan Other (See Comments)    Dehydrate,cramp  . Zoloft [Sertraline Hcl] Other (See Comments)    Shaking   . Zofran [Ondansetron Hcl] Rash    Current Outpatient Medications on File Prior to Visit  Medication Sig Dispense Refill  . ALPRAZolam (XANAX) 0.5 MG tablet Take 0.5 mg by mouth daily.    . bisacodyl (DULCOLAX) 5 MG EC tablet Take 5 mg by mouth daily as needed for moderate constipation.    . cyclobenzaprine (FLEXERIL) 10 MG tablet Take 10 mg by mouth 3 (three) times daily as needed. Muscle Spasms    . diphenhydrAMINE (BENADRYL) 25 MG tablet Take 25 mg by mouth every 6 (six) hours as needed. Allergies    . esomeprazole (NEXIUM) 40 MG capsule Take 40 mg by mouth daily at 12 noon.    . ezetimibe (ZETIA) 10 MG tablet Take 10 mg by mouth daily.    . fluticasone (FLONASE) 50 MCG/ACT nasal spray Place 2 sprays into the nose daily.    Marland Kitchen levothyroxine (SYNTHROID, LEVOTHROID) 75 MCG tablet Take 75 mcg by mouth daily before breakfast.    .  lubiprostone (AMITIZA) 24 MCG capsule Take 24 mcg by mouth 2 (two) times daily with a meal.    . metFORMIN (GLUCOPHAGE) 500 MG tablet Take 500 mg by mouth daily with breakfast.    . Multiple Vitamin (MULTIVITAMIN WITH MINERALS) TABS Take 1 tablet by mouth daily.    . naproxen (NAPROSYN) 500 MG tablet Take 500 mg by mouth 2 (two) times daily with a meal.    . polyethylene glycol (MIRALAX / GLYCOLAX) packet Take 17 g by mouth daily as needed.    . promethazine (PHENERGAN) 25 MG tablet Take 25 mg by mouth every 6 (six) hours as needed. Nausea and Vomiting    . rosuvastatin (CRESTOR) 5 MG tablet Take 5 mg by mouth daily.    . valsartan (DIOVAN) 160 MG tablet Take by  mouth daily.     No current facility-administered medications on file prior to visit.         Objective:   Physical Exam Blood pressure (!) 150/80, pulse 72, temperature 97.7 F (36.5 C), height 5' (1.524 m), weight 127 lb 6.4 oz (57.8 kg). Alert and oriented. Skin warm and dry. Oral mucosa is moist.   . Sclera anicteric, conjunctivae is pink. Thyroid not enlarged. No cervical lymphadenopathy. Lungs clear. Heart regular rate and rhythm.  Abdomen is soft. Bowel sounds are positive. No hepatomegaly. No abdominal masses felt. No tenderness.  No edema to lower extremities.          Assessment & Plan:  Dysphagia: Am  Going to get an DG Esophagram. If she does need an EGD/ED, will need to be under propofol.

## 2018-06-24 ENCOUNTER — Ambulatory Visit (HOSPITAL_COMMUNITY)
Admission: RE | Admit: 2018-06-24 | Discharge: 2018-06-24 | Disposition: A | Discharge status: Home / Self Care | Payer: Medicare Other | Source: Ambulatory Visit | Attending: Internal Medicine | Admitting: Internal Medicine

## 2018-06-24 DIAGNOSIS — R1319 Other dysphagia: Secondary | ICD-10-CM

## 2018-06-24 DIAGNOSIS — R1310 Dysphagia, unspecified: Secondary | ICD-10-CM | POA: Insufficient documentation

## 2018-06-24 DIAGNOSIS — K449 Diaphragmatic hernia without obstruction or gangrene: Secondary | ICD-10-CM | POA: Diagnosis not present

## 2018-08-18 DIAGNOSIS — K210 Gastro-esophageal reflux disease with esophagitis: Secondary | ICD-10-CM | POA: Diagnosis not present

## 2018-08-18 DIAGNOSIS — E782 Mixed hyperlipidemia: Secondary | ICD-10-CM | POA: Diagnosis not present

## 2018-08-18 DIAGNOSIS — E1165 Type 2 diabetes mellitus with hyperglycemia: Secondary | ICD-10-CM | POA: Diagnosis not present

## 2018-08-18 DIAGNOSIS — I10 Essential (primary) hypertension: Secondary | ICD-10-CM | POA: Diagnosis not present

## 2018-08-18 DIAGNOSIS — E039 Hypothyroidism, unspecified: Secondary | ICD-10-CM | POA: Diagnosis not present

## 2018-08-22 DIAGNOSIS — I10 Essential (primary) hypertension: Secondary | ICD-10-CM | POA: Diagnosis not present

## 2018-08-22 DIAGNOSIS — J329 Chronic sinusitis, unspecified: Secondary | ICD-10-CM | POA: Diagnosis not present

## 2018-08-22 DIAGNOSIS — Z6824 Body mass index (BMI) 24.0-24.9, adult: Secondary | ICD-10-CM | POA: Diagnosis not present

## 2018-08-22 DIAGNOSIS — Z1212 Encounter for screening for malignant neoplasm of rectum: Secondary | ICD-10-CM | POA: Diagnosis not present

## 2018-08-22 DIAGNOSIS — Z0001 Encounter for general adult medical examination with abnormal findings: Secondary | ICD-10-CM | POA: Diagnosis not present

## 2018-11-17 DIAGNOSIS — E782 Mixed hyperlipidemia: Secondary | ICD-10-CM | POA: Diagnosis not present

## 2018-11-17 DIAGNOSIS — E039 Hypothyroidism, unspecified: Secondary | ICD-10-CM | POA: Diagnosis not present

## 2018-11-17 DIAGNOSIS — I10 Essential (primary) hypertension: Secondary | ICD-10-CM | POA: Diagnosis not present

## 2018-11-17 DIAGNOSIS — K210 Gastro-esophageal reflux disease with esophagitis: Secondary | ICD-10-CM | POA: Diagnosis not present

## 2018-11-17 DIAGNOSIS — E1165 Type 2 diabetes mellitus with hyperglycemia: Secondary | ICD-10-CM | POA: Diagnosis not present

## 2018-11-21 DIAGNOSIS — K580 Irritable bowel syndrome with diarrhea: Secondary | ICD-10-CM | POA: Diagnosis not present

## 2018-11-21 DIAGNOSIS — G252 Other specified forms of tremor: Secondary | ICD-10-CM | POA: Diagnosis not present

## 2018-11-21 DIAGNOSIS — Z6824 Body mass index (BMI) 24.0-24.9, adult: Secondary | ICD-10-CM | POA: Diagnosis not present

## 2018-11-21 DIAGNOSIS — E039 Hypothyroidism, unspecified: Secondary | ICD-10-CM | POA: Diagnosis not present

## 2018-11-21 DIAGNOSIS — I10 Essential (primary) hypertension: Secondary | ICD-10-CM | POA: Diagnosis not present

## 2018-11-21 DIAGNOSIS — E1165 Type 2 diabetes mellitus with hyperglycemia: Secondary | ICD-10-CM | POA: Diagnosis not present

## 2018-11-29 DIAGNOSIS — R300 Dysuria: Secondary | ICD-10-CM | POA: Diagnosis not present

## 2018-11-29 DIAGNOSIS — Z6824 Body mass index (BMI) 24.0-24.9, adult: Secondary | ICD-10-CM | POA: Diagnosis not present

## 2019-02-01 ENCOUNTER — Other Ambulatory Visit: Payer: Self-pay

## 2019-02-01 NOTE — Patient Outreach (Signed)
[place] [facility]) Care Management  02/01/2019  Erika Ward 1950-12-26 [id number]   Medication Adherence call to Mrs. [name] patients telephone number is disconnected patient is showing past due under [place].  [place] Management Direct Dial [phone]  Fax [phone] [email or web address]

## 2019-02-16 ENCOUNTER — Other Ambulatory Visit: Payer: Self-pay

## 2019-02-16 ENCOUNTER — Ambulatory Visit (INDEPENDENT_AMBULATORY_CARE_PROVIDER_SITE_OTHER): Payer: Medicare Other | Admitting: Otolaryngology

## 2019-02-16 DIAGNOSIS — H903 Sensorineural hearing loss, bilateral: Secondary | ICD-10-CM

## 2019-02-16 DIAGNOSIS — R42 Dizziness and giddiness: Secondary | ICD-10-CM

## 2019-02-16 DIAGNOSIS — H9209 Otalgia, unspecified ear: Secondary | ICD-10-CM

## 2019-02-17 DIAGNOSIS — E782 Mixed hyperlipidemia: Secondary | ICD-10-CM | POA: Diagnosis not present

## 2019-02-17 DIAGNOSIS — E039 Hypothyroidism, unspecified: Secondary | ICD-10-CM | POA: Diagnosis not present

## 2019-02-17 DIAGNOSIS — E1165 Type 2 diabetes mellitus with hyperglycemia: Secondary | ICD-10-CM | POA: Diagnosis not present

## 2019-02-17 DIAGNOSIS — K210 Gastro-esophageal reflux disease with esophagitis: Secondary | ICD-10-CM | POA: Diagnosis not present

## 2019-02-22 DIAGNOSIS — M797 Fibromyalgia: Secondary | ICD-10-CM | POA: Diagnosis not present

## 2019-02-22 DIAGNOSIS — Z23 Encounter for immunization: Secondary | ICD-10-CM | POA: Diagnosis not present

## 2019-02-22 DIAGNOSIS — I10 Essential (primary) hypertension: Secondary | ICD-10-CM | POA: Diagnosis not present

## 2019-02-22 DIAGNOSIS — Z6823 Body mass index (BMI) 23.0-23.9, adult: Secondary | ICD-10-CM | POA: Diagnosis not present

## 2019-02-22 DIAGNOSIS — E1165 Type 2 diabetes mellitus with hyperglycemia: Secondary | ICD-10-CM | POA: Diagnosis not present

## 2019-02-22 DIAGNOSIS — G252 Other specified forms of tremor: Secondary | ICD-10-CM | POA: Diagnosis not present

## 2019-03-03 ENCOUNTER — Other Ambulatory Visit: Payer: Self-pay

## 2019-03-03 NOTE — Patient Outreach (Signed)
[place] [facility]) Care Management  03/03/2019  Erika Ward 1950-11-20 [id number]  Medication Adherence call to Erika Ward patients telephone number is disconnected patient is showing past due on Losartan 100 mg under [place].   [place] Management Direct Dial [phone]  Fax [phone] [email or web address]

## 2019-03-14 DIAGNOSIS — H903 Sensorineural hearing loss, bilateral: Secondary | ICD-10-CM | POA: Diagnosis not present

## 2019-03-14 DIAGNOSIS — R42 Dizziness and giddiness: Secondary | ICD-10-CM | POA: Diagnosis not present

## 2019-03-22 ENCOUNTER — Other Ambulatory Visit: Payer: Self-pay

## 2019-03-22 NOTE — Patient Outreach (Signed)
[place] [facility]) Care Management  03/22/2019  Erika Ward 1951/03/18 [id number]   Medication Adherence call to Erika Ward patient did not answer voice mail not set up patient is past due on Losartan 100 mg under [place].   [place] Management Direct Dial [phone]  Fax [phone] [email or web address]

## 2019-03-23 ENCOUNTER — Ambulatory Visit (INDEPENDENT_AMBULATORY_CARE_PROVIDER_SITE_OTHER): Payer: Medicare Other | Admitting: Otolaryngology

## 2019-03-28 DIAGNOSIS — M797 Fibromyalgia: Secondary | ICD-10-CM | POA: Diagnosis not present

## 2019-03-28 DIAGNOSIS — E119 Type 2 diabetes mellitus without complications: Secondary | ICD-10-CM | POA: Diagnosis not present

## 2019-03-28 DIAGNOSIS — I10 Essential (primary) hypertension: Secondary | ICD-10-CM | POA: Diagnosis not present

## 2019-03-28 DIAGNOSIS — E782 Mixed hyperlipidemia: Secondary | ICD-10-CM | POA: Diagnosis not present

## 2019-03-28 DIAGNOSIS — E039 Hypothyroidism, unspecified: Secondary | ICD-10-CM | POA: Diagnosis not present

## 2019-04-11 DIAGNOSIS — I10 Essential (primary) hypertension: Secondary | ICD-10-CM | POA: Diagnosis not present

## 2019-04-11 DIAGNOSIS — Z6823 Body mass index (BMI) 23.0-23.9, adult: Secondary | ICD-10-CM | POA: Diagnosis not present

## 2019-04-19 DIAGNOSIS — I10 Essential (primary) hypertension: Secondary | ICD-10-CM | POA: Diagnosis not present

## 2019-05-25 DIAGNOSIS — K219 Gastro-esophageal reflux disease without esophagitis: Secondary | ICD-10-CM | POA: Diagnosis not present

## 2019-05-25 DIAGNOSIS — E1165 Type 2 diabetes mellitus with hyperglycemia: Secondary | ICD-10-CM | POA: Diagnosis not present

## 2019-05-25 DIAGNOSIS — M797 Fibromyalgia: Secondary | ICD-10-CM | POA: Diagnosis not present

## 2019-05-25 DIAGNOSIS — I10 Essential (primary) hypertension: Secondary | ICD-10-CM | POA: Diagnosis not present

## 2019-05-25 DIAGNOSIS — E782 Mixed hyperlipidemia: Secondary | ICD-10-CM | POA: Diagnosis not present

## 2019-05-25 DIAGNOSIS — E039 Hypothyroidism, unspecified: Secondary | ICD-10-CM | POA: Diagnosis not present

## 2019-05-30 DIAGNOSIS — M810 Age-related osteoporosis without current pathological fracture: Secondary | ICD-10-CM | POA: Diagnosis not present

## 2019-05-30 DIAGNOSIS — Z23 Encounter for immunization: Secondary | ICD-10-CM | POA: Diagnosis not present

## 2019-05-30 DIAGNOSIS — K580 Irritable bowel syndrome with diarrhea: Secondary | ICD-10-CM | POA: Diagnosis not present

## 2019-05-30 DIAGNOSIS — G252 Other specified forms of tremor: Secondary | ICD-10-CM | POA: Diagnosis not present

## 2019-05-30 DIAGNOSIS — E039 Hypothyroidism, unspecified: Secondary | ICD-10-CM | POA: Diagnosis not present

## 2019-05-30 DIAGNOSIS — M5136 Other intervertebral disc degeneration, lumbar region: Secondary | ICD-10-CM | POA: Diagnosis not present

## 2019-05-30 DIAGNOSIS — E1165 Type 2 diabetes mellitus with hyperglycemia: Secondary | ICD-10-CM | POA: Diagnosis not present

## 2019-05-30 DIAGNOSIS — M797 Fibromyalgia: Secondary | ICD-10-CM | POA: Diagnosis not present

## 2019-05-30 DIAGNOSIS — K219 Gastro-esophageal reflux disease without esophagitis: Secondary | ICD-10-CM | POA: Diagnosis not present

## 2019-05-30 DIAGNOSIS — E782 Mixed hyperlipidemia: Secondary | ICD-10-CM | POA: Diagnosis not present

## 2019-05-30 DIAGNOSIS — I10 Essential (primary) hypertension: Secondary | ICD-10-CM | POA: Diagnosis not present

## 2019-06-06 ENCOUNTER — Other Ambulatory Visit: Payer: Self-pay

## 2019-06-06 NOTE — Patient Outreach (Signed)
[place] [facility]) Care Management  06/06/2019  LARETHA CORNACCHIA 11/06/1950 [id number]   Medication Adherence call to Mrs. Donne Wade Telephone call to Patient regarding Medication Adherence unable to reach patient. Mrs. [place] telephone number has restrictions,patient is past due on Metformin under [place].   [place] Management Direct Dial [phone]  Fax [phone] [email or web address]

## 2019-06-13 ENCOUNTER — Other Ambulatory Visit: Payer: Self-pay

## 2019-06-13 NOTE — Patient Outreach (Signed)
[place] [facility]) Care Management  06/13/2019  MALLORY DOETSCH [birth year] [id number]   Medication Adherence call to Mrs. Thembi Mcfarlain patients telephone number is disconnected  Mrs. Carling is showing past due on Metformin 500 mg under [place].   [place] Management Direct Dial [phone]  Fax [phone] [email or web address]

## 2019-06-22 DIAGNOSIS — H3540 Unspecified peripheral retinal degeneration: Secondary | ICD-10-CM | POA: Diagnosis not present

## 2019-06-22 DIAGNOSIS — H524 Presbyopia: Secondary | ICD-10-CM | POA: Diagnosis not present

## 2019-06-28 DIAGNOSIS — M545 Low back pain: Secondary | ICD-10-CM | POA: Diagnosis not present

## 2019-07-24 DIAGNOSIS — H2512 Age-related nuclear cataract, left eye: Secondary | ICD-10-CM | POA: Diagnosis not present

## 2019-08-28 DIAGNOSIS — E1165 Type 2 diabetes mellitus with hyperglycemia: Secondary | ICD-10-CM | POA: Diagnosis not present

## 2019-08-28 DIAGNOSIS — I10 Essential (primary) hypertension: Secondary | ICD-10-CM | POA: Diagnosis not present

## 2019-08-28 DIAGNOSIS — K2100 Gastro-esophageal reflux disease with esophagitis, without bleeding: Secondary | ICD-10-CM | POA: Diagnosis not present

## 2019-08-28 DIAGNOSIS — E039 Hypothyroidism, unspecified: Secondary | ICD-10-CM | POA: Diagnosis not present

## 2019-08-28 DIAGNOSIS — E782 Mixed hyperlipidemia: Secondary | ICD-10-CM | POA: Diagnosis not present

## 2019-09-01 DIAGNOSIS — M545 Low back pain: Secondary | ICD-10-CM | POA: Diagnosis not present

## 2019-09-01 DIAGNOSIS — I10 Essential (primary) hypertension: Secondary | ICD-10-CM | POA: Diagnosis not present

## 2019-09-01 DIAGNOSIS — E1165 Type 2 diabetes mellitus with hyperglycemia: Secondary | ICD-10-CM | POA: Diagnosis not present

## 2019-09-01 DIAGNOSIS — M797 Fibromyalgia: Secondary | ICD-10-CM | POA: Diagnosis not present

## 2019-09-01 DIAGNOSIS — K219 Gastro-esophageal reflux disease without esophagitis: Secondary | ICD-10-CM | POA: Diagnosis not present

## 2019-09-01 DIAGNOSIS — Z0001 Encounter for general adult medical examination with abnormal findings: Secondary | ICD-10-CM | POA: Diagnosis not present

## 2019-09-01 DIAGNOSIS — R4582 Worries: Secondary | ICD-10-CM | POA: Diagnosis not present

## 2019-10-02 DIAGNOSIS — E039 Hypothyroidism, unspecified: Secondary | ICD-10-CM | POA: Diagnosis not present

## 2019-10-02 DIAGNOSIS — S199XXA Unspecified injury of neck, initial encounter: Secondary | ICD-10-CM | POA: Diagnosis not present

## 2019-10-02 DIAGNOSIS — E7800 Pure hypercholesterolemia, unspecified: Secondary | ICD-10-CM | POA: Diagnosis not present

## 2019-10-02 DIAGNOSIS — S39012A Strain of muscle, fascia and tendon of lower back, initial encounter: Secondary | ICD-10-CM | POA: Diagnosis not present

## 2019-10-02 DIAGNOSIS — S161XXA Strain of muscle, fascia and tendon at neck level, initial encounter: Secondary | ICD-10-CM | POA: Diagnosis not present

## 2019-10-02 DIAGNOSIS — M545 Low back pain: Secondary | ICD-10-CM | POA: Diagnosis not present

## 2019-10-02 DIAGNOSIS — M810 Age-related osteoporosis without current pathological fracture: Secondary | ICD-10-CM | POA: Diagnosis not present

## 2019-10-02 DIAGNOSIS — E119 Type 2 diabetes mellitus without complications: Secondary | ICD-10-CM | POA: Diagnosis not present

## 2019-10-02 DIAGNOSIS — Z888 Allergy status to other drugs, medicaments and biological substances status: Secondary | ICD-10-CM | POA: Diagnosis not present

## 2019-10-02 DIAGNOSIS — Z79891 Long term (current) use of opiate analgesic: Secondary | ICD-10-CM | POA: Diagnosis not present

## 2019-10-02 DIAGNOSIS — Z79899 Other long term (current) drug therapy: Secondary | ICD-10-CM | POA: Diagnosis not present

## 2019-10-02 DIAGNOSIS — I10 Essential (primary) hypertension: Secondary | ICD-10-CM | POA: Diagnosis not present

## 2019-10-02 DIAGNOSIS — K219 Gastro-esophageal reflux disease without esophagitis: Secondary | ICD-10-CM | POA: Diagnosis not present

## 2019-10-09 DIAGNOSIS — H35033 Hypertensive retinopathy, bilateral: Secondary | ICD-10-CM | POA: Diagnosis not present

## 2019-10-09 DIAGNOSIS — H2512 Age-related nuclear cataract, left eye: Secondary | ICD-10-CM | POA: Diagnosis not present

## 2019-10-09 DIAGNOSIS — H52213 Irregular astigmatism, bilateral: Secondary | ICD-10-CM | POA: Diagnosis not present

## 2019-10-09 DIAGNOSIS — H35363 Drusen (degenerative) of macula, bilateral: Secondary | ICD-10-CM | POA: Diagnosis not present

## 2019-10-09 DIAGNOSIS — H25012 Cortical age-related cataract, left eye: Secondary | ICD-10-CM | POA: Diagnosis not present

## 2019-11-09 DIAGNOSIS — Z1231 Encounter for screening mammogram for malignant neoplasm of breast: Secondary | ICD-10-CM | POA: Diagnosis not present

## 2019-11-09 IMAGING — RF DG ESOPHAGUS
11 series · 14 of 24 positions shown · non-contrast
Comparison: None

CLINICAL DATA: Mid epigastric pain, feels like food hangs in the
distal esophagus for 2-3 months, episodic mid to lower chest pain,
history hiatal hernia, GERD, diabetes mellitus, hypertension

EXAM:
ESOPHOGRAM / BARIUM SWALLOW / BARIUM TABLET STUDY
TECHNIQUE: Combined double contrast and single contrast examination performed
using effervescent crystals, thick barium liquid, and thin barium
liquid. The patient was observed with fluoroscopy swallowing a 13 mm
barium sulphate tablet.
FLUOROSCOPY TIME:  Fluoroscopy Time:  2 minutes 36 seconds
Radiation Exposure Index (if provided by the fluoroscopic device):
30.2 mGy
Number of Acquired Spot Images: multiple fluoroscopic screen
captures

[Series 1: cp_standard · 0.20mm/px · 1 of 86 frames shown (1 of 11)]
[frame 1/86]
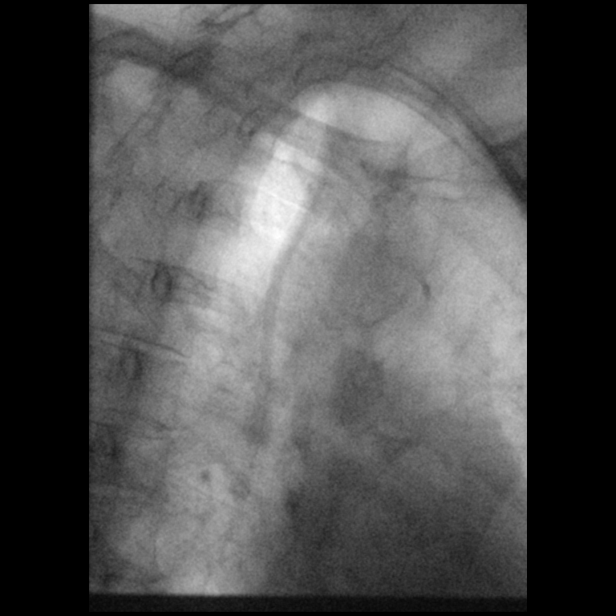

[Series 2: cp_standard · 0.20mm/px · 2 of 152 frames shown (2 of 11)]
[frame 15/152]
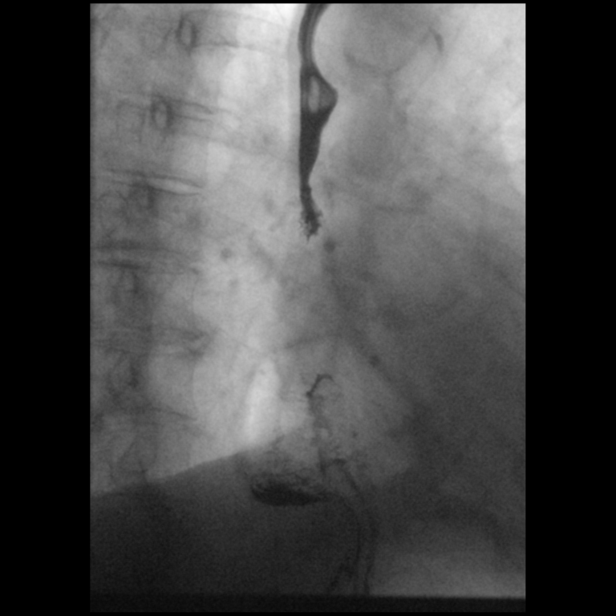
[frame 130/152]
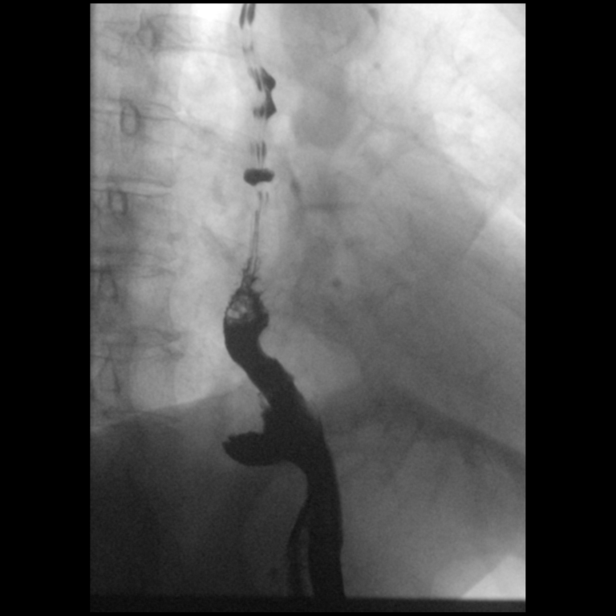

[Series 3: cp_standard · 0.20mm/px · 1 of 58 frames shown (3 of 11)]
[frame 53/58]
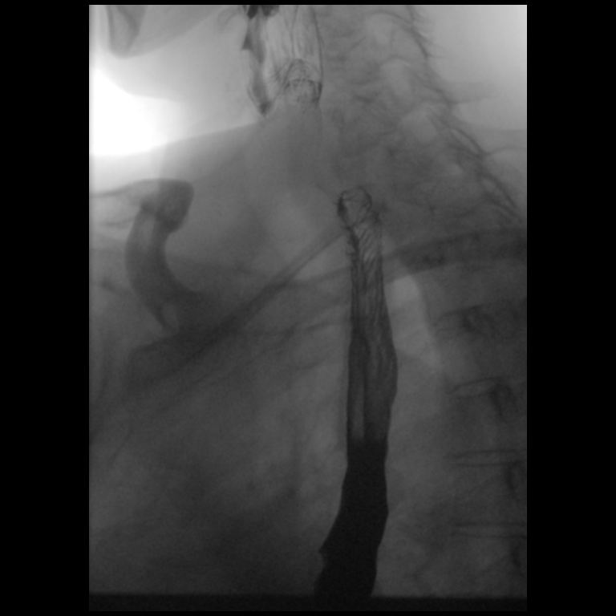

[Series 4: cp_standard · 0.20mm/px · 1 of 144 frames shown (4 of 11)]
[frame 36/144]
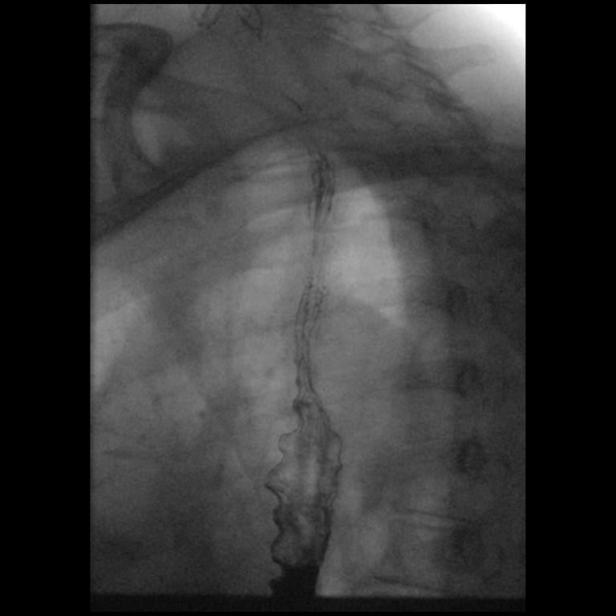

[Series 5: cp_standard · 0.19mm/px · 1 of 67 frames shown (5 of 11)]
[frame 11/67]
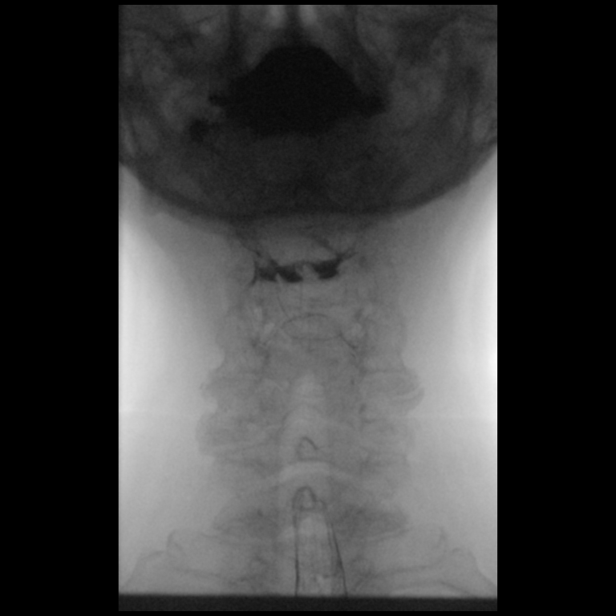

[Series 6: cp_standard · 0.18mm/px · 2 of 80 frames shown (6 of 11)]
[frame 16/80]
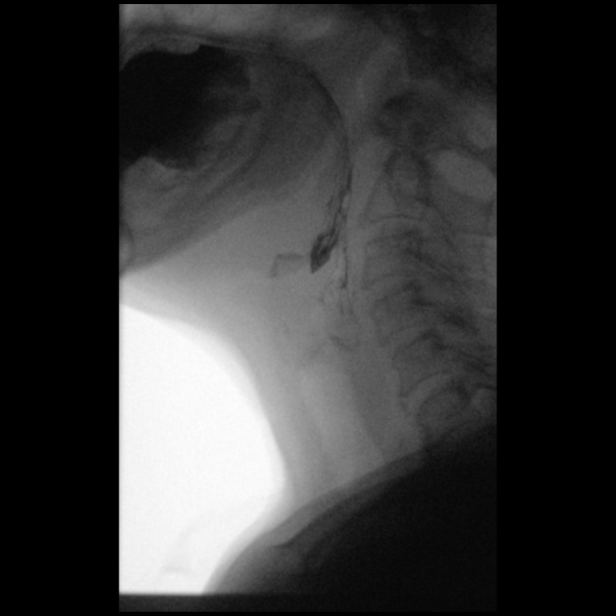
[frame 69/80]
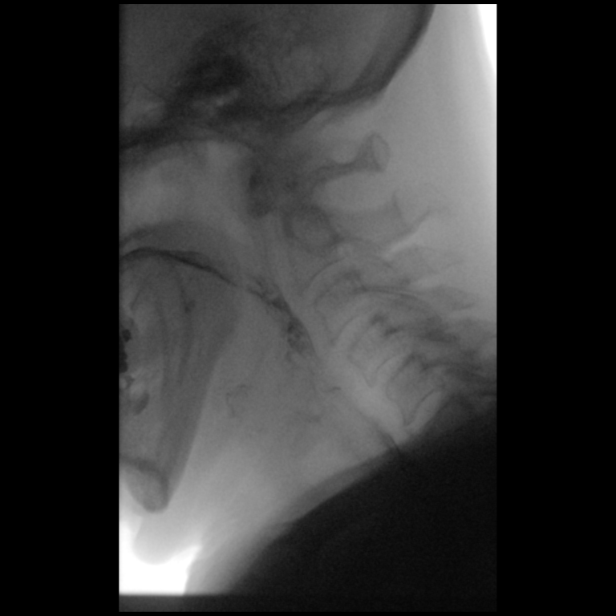

[Series 7: cp_standard · 0.17mm/px · 1 of 196 frames shown (7 of 11)]
[frame 167/196]
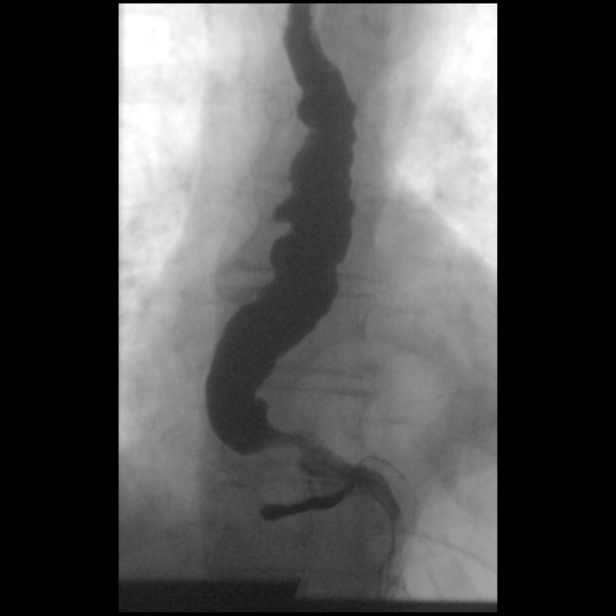

[Series 8: cp_standard · 0.18mm/px · 1 of 21 frames shown (8 of 11)]
[frame 15/21]
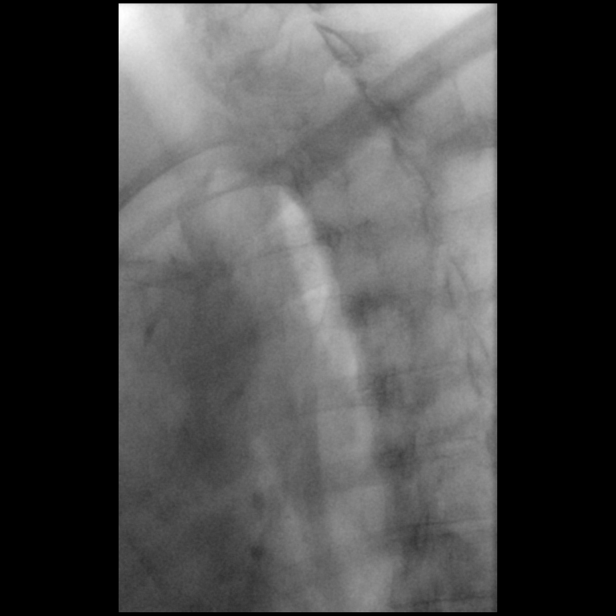

[Series 9: cp_standard · 0.18mm/px · 1 of 45 frames shown (9 of 11)]
[frame 23/45]
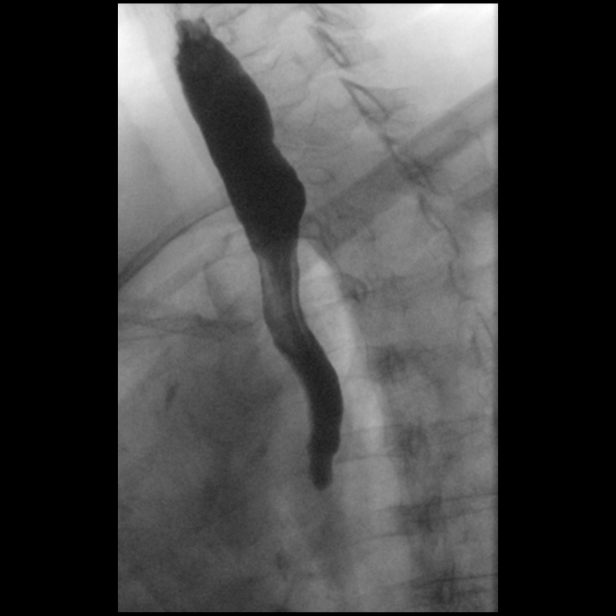

[Series 10: cp_standard · 0.18mm/px · 2 of 14 frames shown (10 of 11)]
[frame 3/14]
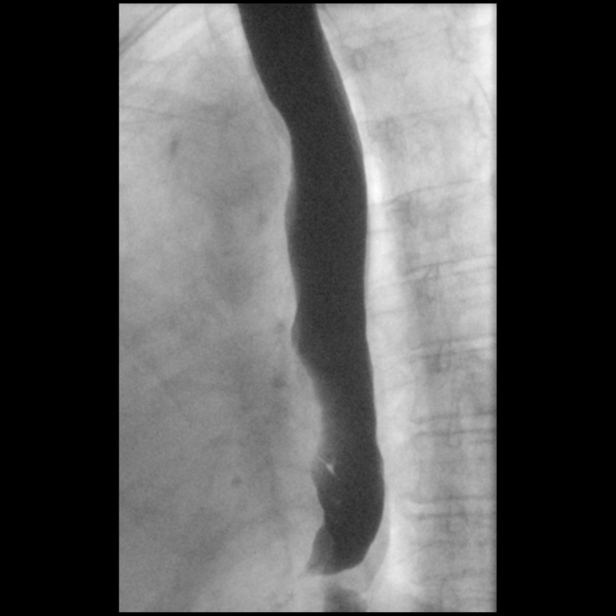
[frame 12/14]
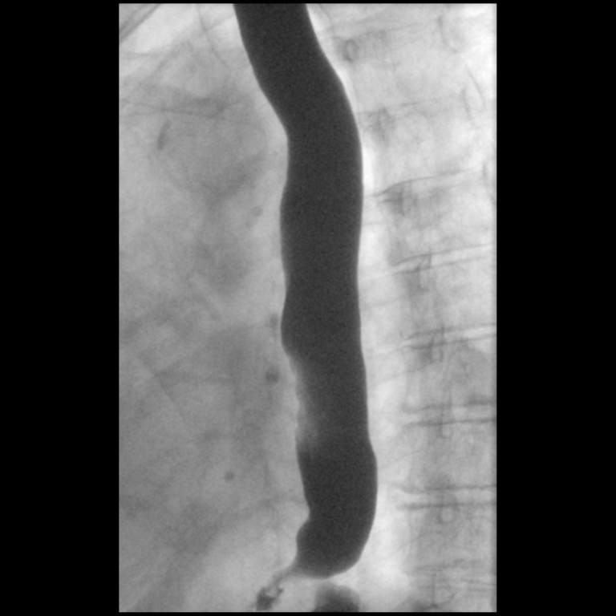

[Series 11: cp_standard · 0.19mm/px · 1 of 196 frames shown (11 of 11)]
[frame 167/196]
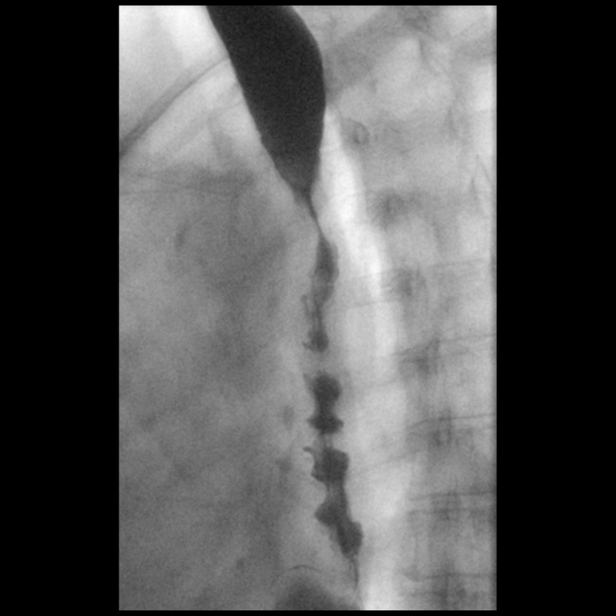

[14 of 24 positions shown; findings below may reference images not displayed]

FINDINGS: Esophageal distention: Normal distention without mass or stricture

Filling defects: Minimally prominent cricopharyngeus. No additional
filling defects.

12.5 mm barium tablet: Passed from oral cavity into stomach without
obstruction

Motility: Moderate diffuse impairment of esophageal motility.
Incomplete clearance of barium by primary peristaltic waves with
scattered secondary and tertiary waves noted.

Mucosa:  Smooth without irregularity or ulceration

Hypopharynx/cervical esophagus: Normal motion without laryngeal
penetration or aspiration.

Hiatal hernia:  Small to moderate-sized hiatal hernia present

GE reflux:  Not witnessed during exam

Other: Patient had several episodes of chest pain during the
procedure; no obvious esophageal spasm identified during these
episodes.
IMPRESSION: Small to moderate-sized hiatal hernia.

Diffuse esophageal dysmotility.

No other significant abnormalities.

## 2019-12-04 DIAGNOSIS — E1165 Type 2 diabetes mellitus with hyperglycemia: Secondary | ICD-10-CM | POA: Diagnosis not present

## 2019-12-04 DIAGNOSIS — I10 Essential (primary) hypertension: Secondary | ICD-10-CM | POA: Diagnosis not present

## 2019-12-04 DIAGNOSIS — M797 Fibromyalgia: Secondary | ICD-10-CM | POA: Diagnosis not present

## 2019-12-04 DIAGNOSIS — K2100 Gastro-esophageal reflux disease with esophagitis, without bleeding: Secondary | ICD-10-CM | POA: Diagnosis not present

## 2019-12-04 DIAGNOSIS — E782 Mixed hyperlipidemia: Secondary | ICD-10-CM | POA: Diagnosis not present

## 2019-12-07 DIAGNOSIS — K580 Irritable bowel syndrome with diarrhea: Secondary | ICD-10-CM | POA: Diagnosis not present

## 2019-12-07 DIAGNOSIS — E039 Hypothyroidism, unspecified: Secondary | ICD-10-CM | POA: Diagnosis not present

## 2019-12-07 DIAGNOSIS — I10 Essential (primary) hypertension: Secondary | ICD-10-CM | POA: Diagnosis not present

## 2019-12-07 DIAGNOSIS — R4582 Worries: Secondary | ICD-10-CM | POA: Diagnosis not present

## 2019-12-07 DIAGNOSIS — E1165 Type 2 diabetes mellitus with hyperglycemia: Secondary | ICD-10-CM | POA: Diagnosis not present

## 2019-12-15 DIAGNOSIS — Z9049 Acquired absence of other specified parts of digestive tract: Secondary | ICD-10-CM | POA: Diagnosis not present

## 2019-12-15 DIAGNOSIS — N281 Cyst of kidney, acquired: Secondary | ICD-10-CM | POA: Diagnosis not present

## 2019-12-15 DIAGNOSIS — Q600 Renal agenesis, unilateral: Secondary | ICD-10-CM | POA: Diagnosis not present

## 2019-12-19 DIAGNOSIS — H25012 Cortical age-related cataract, left eye: Secondary | ICD-10-CM | POA: Diagnosis not present

## 2019-12-19 DIAGNOSIS — H2512 Age-related nuclear cataract, left eye: Secondary | ICD-10-CM | POA: Diagnosis not present

## 2020-01-03 DIAGNOSIS — R300 Dysuria: Secondary | ICD-10-CM | POA: Diagnosis not present

## 2020-02-09 DIAGNOSIS — M545 Low back pain: Secondary | ICD-10-CM | POA: Diagnosis not present

## 2020-02-13 ENCOUNTER — Encounter (INDEPENDENT_AMBULATORY_CARE_PROVIDER_SITE_OTHER): Payer: Self-pay | Admitting: Gastroenterology

## 2020-03-05 DIAGNOSIS — M5136 Other intervertebral disc degeneration, lumbar region: Secondary | ICD-10-CM | POA: Diagnosis not present

## 2020-03-05 DIAGNOSIS — M545 Low back pain: Secondary | ICD-10-CM | POA: Diagnosis not present

## 2020-03-05 DIAGNOSIS — Z23 Encounter for immunization: Secondary | ICD-10-CM | POA: Diagnosis not present

## 2020-03-06 ENCOUNTER — Ambulatory Visit (INDEPENDENT_AMBULATORY_CARE_PROVIDER_SITE_OTHER): Payer: Medicare Other | Admitting: Gastroenterology

## 2020-03-07 DIAGNOSIS — E782 Mixed hyperlipidemia: Secondary | ICD-10-CM | POA: Diagnosis not present

## 2020-03-07 DIAGNOSIS — I10 Essential (primary) hypertension: Secondary | ICD-10-CM | POA: Diagnosis not present

## 2020-03-07 DIAGNOSIS — E1165 Type 2 diabetes mellitus with hyperglycemia: Secondary | ICD-10-CM | POA: Diagnosis not present

## 2020-03-07 DIAGNOSIS — E039 Hypothyroidism, unspecified: Secondary | ICD-10-CM | POA: Diagnosis not present

## 2020-03-07 DIAGNOSIS — K2100 Gastro-esophageal reflux disease with esophagitis, without bleeding: Secondary | ICD-10-CM | POA: Diagnosis not present

## 2020-03-11 DIAGNOSIS — E039 Hypothyroidism, unspecified: Secondary | ICD-10-CM | POA: Diagnosis not present

## 2020-03-11 DIAGNOSIS — K580 Irritable bowel syndrome with diarrhea: Secondary | ICD-10-CM | POA: Diagnosis not present

## 2020-03-11 DIAGNOSIS — I10 Essential (primary) hypertension: Secondary | ICD-10-CM | POA: Diagnosis not present

## 2020-03-11 DIAGNOSIS — Z23 Encounter for immunization: Secondary | ICD-10-CM | POA: Diagnosis not present

## 2020-03-11 DIAGNOSIS — E1165 Type 2 diabetes mellitus with hyperglycemia: Secondary | ICD-10-CM | POA: Diagnosis not present

## 2020-03-14 DIAGNOSIS — E7849 Other hyperlipidemia: Secondary | ICD-10-CM | POA: Diagnosis not present

## 2020-03-14 DIAGNOSIS — E1165 Type 2 diabetes mellitus with hyperglycemia: Secondary | ICD-10-CM | POA: Diagnosis not present

## 2020-03-14 DIAGNOSIS — I10 Essential (primary) hypertension: Secondary | ICD-10-CM | POA: Diagnosis not present

## 2020-03-19 DIAGNOSIS — M47816 Spondylosis without myelopathy or radiculopathy, lumbar region: Secondary | ICD-10-CM | POA: Diagnosis not present

## 2020-03-19 DIAGNOSIS — M5136 Other intervertebral disc degeneration, lumbar region: Secondary | ICD-10-CM | POA: Diagnosis not present

## 2020-03-19 DIAGNOSIS — M545 Low back pain, unspecified: Secondary | ICD-10-CM | POA: Diagnosis not present

## 2020-03-28 ENCOUNTER — Ambulatory Visit (INDEPENDENT_AMBULATORY_CARE_PROVIDER_SITE_OTHER): Payer: Medicare Other | Admitting: Gastroenterology

## 2020-04-01 DIAGNOSIS — M7138 Other bursal cyst, other site: Secondary | ICD-10-CM | POA: Diagnosis not present

## 2020-04-03 ENCOUNTER — Other Ambulatory Visit: Payer: Self-pay | Admitting: Neurosurgery

## 2020-04-09 ENCOUNTER — Other Ambulatory Visit: Payer: Self-pay

## 2020-04-09 ENCOUNTER — Encounter (HOSPITAL_COMMUNITY): Payer: Self-pay | Admitting: Neurosurgery

## 2020-04-09 NOTE — Anesthesia Preprocedure Evaluation (Addendum)
Anesthesia Evaluation  Patient identified by MRN, date of birth, ID band Patient awake    Reviewed: Allergy & Precautions, NPO status , Patient's Chart, lab work & pertinent test results  Airway Mallampati: II  TM Distance: >3 FB Neck ROM: Full    Dental no notable dental hx. (+) Teeth Intact, Dental Advisory Given   Pulmonary former smoker,    Pulmonary exam normal breath sounds clear to auscultation       Cardiovascular hypertension, Pt. on medications Normal cardiovascular exam Rhythm:Regular Rate:Normal     Neuro/Psych PSYCHIATRIC DISORDERS Anxiety Depression negative neurological ROS     GI/Hepatic GERD  Medicated and Controlled,  Endo/Other  diabetesHypothyroidism   Renal/GU      Musculoskeletal  (+) Arthritis ,   Abdominal   Peds  Hematology   Anesthesia Other Findings All: see list  Reproductive/Obstetrics                            Anesthesia Physical Anesthesia Plan  ASA: II  Anesthesia Plan: General   Post-op Pain Management:    Induction: Intravenous  PONV Risk Score and Plan: 3 and Treatment may vary due to age or medical condition, Ondansetron, Dexamethasone and Midazolam  Airway Management Planned: Oral ETT  Additional Equipment:   Intra-op Plan:   Post-operative Plan: Extubation in OR  Informed Consent: I have reviewed the patients History and Physical, chart, labs and discussed the procedure including the risks, benefits and alternatives for the proposed anesthesia with the patient or authorized representative who has indicated his/her understanding and acceptance.     Dental advisory given  Plan Discussed with: CRNA and Anesthesiologist  Anesthesia Plan Comments:         Anesthesia Quick Evaluation

## 2020-04-09 NOTE — Progress Notes (Signed)
Spoke with pt for pre-op call. Pt denies cardiac history, but is treated for HTN and Diabetes. She states her most recent A1C was 6.? About 2 months ago. States she was taken off the Metformin and her doctor said her diabetes looked good. Instructed pt to check her blood sugar when she gets up in the AM and every 2 hours until she leaves for the hospital. If blood sugar is 70 or below, treat with 1/2 cup of clear juice (apple or cranberry) and recheck blood sugar 15 minutes after drinking juice. If blood sugar continues to be 70 or below, call the Short Stay department and ask to speak to a nurse. Pt voiced understanding  Covid test to be done on arrival.

## 2020-04-10 ENCOUNTER — Observation Stay (HOSPITAL_COMMUNITY)
Admission: RE | Admit: 2020-04-10 | Discharge: 2020-04-11 | Disposition: A | Discharge status: Home / Self Care | Payer: Medicare Other | Attending: Neurosurgery | Admitting: Neurosurgery

## 2020-04-10 ENCOUNTER — Ambulatory Visit (HOSPITAL_COMMUNITY): Payer: Medicare Other | Admitting: Anesthesiology

## 2020-04-10 ENCOUNTER — Ambulatory Visit (HOSPITAL_COMMUNITY): Payer: Medicare Other

## 2020-04-10 ENCOUNTER — Encounter (HOSPITAL_COMMUNITY)
Admission: RE | Disposition: A | Discharge status: Home / Self Care | Payer: Self-pay | Source: Home / Self Care | Attending: Neurosurgery

## 2020-04-10 ENCOUNTER — Encounter (HOSPITAL_COMMUNITY): Payer: Self-pay | Admitting: Neurosurgery

## 2020-04-10 ENCOUNTER — Other Ambulatory Visit: Payer: Self-pay

## 2020-04-10 DIAGNOSIS — Z9889 Other specified postprocedural states: Secondary | ICD-10-CM | POA: Diagnosis not present

## 2020-04-10 DIAGNOSIS — Z419 Encounter for procedure for purposes other than remedying health state, unspecified: Secondary | ICD-10-CM

## 2020-04-10 DIAGNOSIS — I10 Essential (primary) hypertension: Secondary | ICD-10-CM | POA: Diagnosis not present

## 2020-04-10 DIAGNOSIS — Z20822 Contact with and (suspected) exposure to covid-19: Secondary | ICD-10-CM | POA: Insufficient documentation

## 2020-04-10 DIAGNOSIS — E039 Hypothyroidism, unspecified: Secondary | ICD-10-CM | POA: Diagnosis not present

## 2020-04-10 DIAGNOSIS — I700 Atherosclerosis of aorta: Secondary | ICD-10-CM | POA: Diagnosis not present

## 2020-04-10 DIAGNOSIS — E119 Type 2 diabetes mellitus without complications: Secondary | ICD-10-CM | POA: Diagnosis not present

## 2020-04-10 DIAGNOSIS — M7138 Other bursal cyst, other site: Principal | ICD-10-CM | POA: Diagnosis present

## 2020-04-10 DIAGNOSIS — M545 Low back pain, unspecified: Secondary | ICD-10-CM | POA: Diagnosis present

## 2020-04-10 HISTORY — DX: Anxiety disorder, unspecified: F41.9

## 2020-04-10 HISTORY — DX: Depression, unspecified: F32.A

## 2020-04-10 HISTORY — DX: Unspecified osteoarthritis, unspecified site: M19.90

## 2020-04-10 HISTORY — PX: LUMBAR LAMINECTOMY/DECOMPRESSION MICRODISCECTOMY: SHX5026

## 2020-04-10 HISTORY — DX: Pneumonia, unspecified organism: J18.9

## 2020-04-10 HISTORY — DX: Fatty (change of) liver, not elsewhere classified: K76.0

## 2020-04-10 HISTORY — DX: Irritable bowel syndrome, unspecified: K58.9

## 2020-04-10 HISTORY — DX: Other complications of anesthesia, initial encounter: T88.59XA

## 2020-04-10 HISTORY — DX: Sleep apnea, unspecified: G47.30

## 2020-04-10 LAB — BASIC METABOLIC PANEL
Anion gap: 13 (ref 5–15)
BUN: 15 mg/dL (ref 8–23)
CO2: 25 mmol/L (ref 22–32)
Calcium: 9.3 mg/dL (ref 8.9–10.3)
Chloride: 103 mmol/L (ref 98–111)
Creatinine, Ser: 0.91 mg/dL (ref 0.44–1.00)
GFR, Estimated: >60 mL/min (ref >60)
Glucose, Bld: 121 mg/dL — ABNORMAL HIGH (ref 70–99)
Potassium: 3.1 mmol/L — ABNORMAL LOW (ref 3.5–5.1)
Sodium: 141 mmol/L (ref 135–145)

## 2020-04-10 LAB — CBC
HCT: 41.7 % (ref 36.0–46.0)
Hemoglobin: 13.8 g/dL (ref 12.0–15.0)
MCH: 32.1 pg (ref 26.0–34.0)
MCHC: 33.1 g/dL (ref 30.0–36.0)
MCV: 97 fL (ref 80.0–100.0)
Platelets: 328 10*3/uL (ref 150–400)
RBC: 4.3 MIL/uL (ref 3.87–5.11)
RDW: 12.6 % (ref 11.5–15.5)
WBC: 7.7 10*3/uL (ref 4.0–10.5)
nRBC: 0 % (ref 0.0–0.2)

## 2020-04-10 LAB — SARS CORONAVIRUS 2 BY RT PCR (HOSPITAL ORDER, PERFORMED IN ~~LOC~~ HOSPITAL LAB): SARS Coronavirus 2: NEGATIVE

## 2020-04-10 LAB — GLUCOSE, CAPILLARY
Glucose-Capillary: 126 mg/dL — ABNORMAL HIGH (ref 70–99)
Glucose-Capillary: 118 mg/dL — ABNORMAL HIGH (ref 70–99)
Glucose-Capillary: 133 mg/dL — ABNORMAL HIGH (ref 70–99)
Glucose-Capillary: 167 mg/dL — ABNORMAL HIGH (ref 70–99)

## 2020-04-10 LAB — SURGICAL PCR SCREEN
MRSA, PCR: NEGATIVE
Staphylococcus aureus: NEGATIVE

## 2020-04-10 SURGERY — LUMBAR LAMINECTOMY/DECOMPRESSION MICRODISCECTOMY 1 LEVEL
Anesthesia: General | Laterality: Right

## 2020-04-10 MED ORDER — PROPOFOL 10 MG/ML IV BOLUS
INTRAVENOUS | Status: DC | PRN
  Administered 2020-04-10: 120 mg via INTRAVENOUS

## 2020-04-10 MED ORDER — PROMETHAZINE HCL 25 MG PO TABS
25.0000 mg | ORAL_TABLET | Freq: Every morning | ORAL | Status: DC
  Administered 2020-04-11: 25 mg via ORAL
  Filled 2020-04-10: qty 1

## 2020-04-10 MED ORDER — POTASSIUM CHLORIDE IN NACL 20-0.9 MEQ/L-% IV SOLN: INTRAVENOUS | Status: DC

## 2020-04-10 MED ORDER — ACYCLOVIR 400 MG PO TABS
400.0000 mg | ORAL_TABLET | Freq: Every morning | ORAL | Status: DC
  Administered 2020-04-11: 400 mg via ORAL
  Filled 2020-04-10 (×2): qty 1

## 2020-04-10 MED ORDER — SODIUM CHLORIDE 0.9% FLUSH
3.0000 mL | Freq: Two times a day (BID) | INTRAVENOUS | Status: DC
  Administered 2020-04-10: 3 mL via INTRAVENOUS

## 2020-04-10 MED ORDER — MORPHINE SULFATE (PF) 2 MG/ML IV SOLN
2.0000 mg | INTRAVENOUS | Status: DC | PRN
  Administered 2020-04-10: 2 mg via INTRAVENOUS
  Filled 2020-04-10: qty 1

## 2020-04-10 MED ORDER — CYCLOSPORINE 0.05 % OP EMUL
1.0000 [drp] | Freq: Two times a day (BID) | OPHTHALMIC | Status: DC | PRN
  Filled 2020-04-10: qty 1

## 2020-04-10 MED ORDER — BUPIVACAINE HCL (PF) 0.25 % IJ SOLN
INTRAMUSCULAR | Status: DC | PRN
  Administered 2020-04-10: 28 mL

## 2020-04-10 MED ORDER — OXYCODONE HCL 5 MG PO TABS: 5.0000 mg | ORAL_TABLET | ORAL | Status: DC | PRN

## 2020-04-10 MED ORDER — PHENYLEPHRINE HCL (PRESSORS) 10 MG/ML IV SOLN
INTRAVENOUS | Status: DC | PRN
  Administered 2020-04-10 (×2): 80 ug via INTRAVENOUS

## 2020-04-10 MED ORDER — ORAL CARE MOUTH RINSE: 15.0000 mL | Freq: Once | OROMUCOSAL | Status: AC

## 2020-04-10 MED ORDER — FLUTICASONE PROPIONATE 50 MCG/ACT NA SUSP
1.0000 | Freq: Three times a day (TID) | NASAL | Status: DC | PRN
  Filled 2020-04-10: qty 16

## 2020-04-10 MED ORDER — SUGAMMADEX SODIUM 200 MG/2ML IV SOLN
INTRAVENOUS | Status: DC | PRN
  Administered 2020-04-10: 200 mg via INTRAVENOUS

## 2020-04-10 MED ORDER — SENNOSIDES-DOCUSATE SODIUM 8.6-50 MG PO TABS: 1.0000 | ORAL_TABLET | Freq: Every evening | ORAL | Status: DC | PRN

## 2020-04-10 MED ORDER — LIDOCAINE-EPINEPHRINE 0.5 %-1:200000 IJ SOLN
INTRAMUSCULAR | Status: AC
  Filled 2020-04-10: qty 1

## 2020-04-10 MED ORDER — VENLAFAXINE HCL 75 MG PO TABS
75.0000 mg | ORAL_TABLET | Freq: Every morning | ORAL | Status: DC
  Administered 2020-04-10 – 2020-04-11 (×2): 75 mg via ORAL
  Filled 2020-04-10 (×2): qty 1

## 2020-04-10 MED ORDER — CHLORHEXIDINE GLUCONATE 0.12 % MT SOLN
15.0000 mL | Freq: Once | OROMUCOSAL | Status: AC
  Administered 2020-04-10: 15 mL via OROMUCOSAL
  Filled 2020-04-10: qty 15

## 2020-04-10 MED ORDER — LACTATED RINGERS IV SOLN: INTRAVENOUS | Status: DC

## 2020-04-10 MED ORDER — SENNA 8.6 MG PO TABS
1.0000 | ORAL_TABLET | Freq: Two times a day (BID) | ORAL | Status: DC
  Administered 2020-04-10 – 2020-04-11 (×2): 8.6 mg via ORAL
  Filled 2020-04-10 (×2): qty 1

## 2020-04-10 MED ORDER — THROMBIN 5000 UNITS EX SOLR
CUTANEOUS | Status: DC | PRN
  Administered 2020-04-10 (×2): 5000 [IU] via TOPICAL

## 2020-04-10 MED ORDER — SUFENTANIL CITRATE 50 MCG/ML IV SOLN
INTRAVENOUS | Status: AC
  Filled 2020-04-10: qty 1

## 2020-04-10 MED ORDER — ALPRAZOLAM 0.5 MG PO TABS
2.0000 mg | ORAL_TABLET | Freq: Every day | ORAL | Status: DC
  Administered 2020-04-10: 2 mg via ORAL
  Filled 2020-04-10: qty 4

## 2020-04-10 MED ORDER — ZOLPIDEM TARTRATE 5 MG PO TABS: 5.0000 mg | ORAL_TABLET | Freq: Every evening | ORAL | Status: DC | PRN

## 2020-04-10 MED ORDER — DEXAMETHASONE SODIUM PHOSPHATE 10 MG/ML IJ SOLN
INTRAMUSCULAR | Status: DC | PRN
  Administered 2020-04-10: 10 mg via INTRAVENOUS

## 2020-04-10 MED ORDER — MIDAZOLAM HCL 2 MG/2ML IJ SOLN
INTRAMUSCULAR | Status: DC | PRN
  Administered 2020-04-10: 2 mg via INTRAVENOUS

## 2020-04-10 MED ORDER — ACETAMINOPHEN 650 MG RE SUPP: 650.0000 mg | RECTAL | Status: DC | PRN

## 2020-04-10 MED ORDER — PROPOFOL 10 MG/ML IV BOLUS
INTRAVENOUS | Status: AC
  Filled 2020-04-10: qty 20

## 2020-04-10 MED ORDER — LEVOTHYROXINE SODIUM 75 MCG PO TABS
75.0000 ug | ORAL_TABLET | Freq: Every morning | ORAL | Status: DC
  Administered 2020-04-11: 75 ug via ORAL
  Filled 2020-04-10: qty 1

## 2020-04-10 MED ORDER — HYDROMORPHONE HCL 1 MG/ML IJ SOLN
INTRAMUSCULAR | Status: AC
  Administered 2020-04-10: 0.25 mg via INTRAVENOUS
  Filled 2020-04-10: qty 1

## 2020-04-10 MED ORDER — PHENOL 1.4 % MT LIQD: 1.0000 | OROMUCOSAL | Status: DC | PRN

## 2020-04-10 MED ORDER — EZETIMIBE 10 MG PO TABS
10.0000 mg | ORAL_TABLET | Freq: Every morning | ORAL | Status: DC
  Administered 2020-04-10 – 2020-04-11 (×2): 10 mg via ORAL
  Filled 2020-04-10 (×2): qty 1

## 2020-04-10 MED ORDER — CHLORHEXIDINE GLUCONATE CLOTH 2 % EX PADS: 6.0000 | MEDICATED_PAD | Freq: Once | CUTANEOUS | Status: DC

## 2020-04-10 MED ORDER — DEXAMETHASONE 4 MG PO TABS
4.0000 mg | ORAL_TABLET | Freq: Four times a day (QID) | ORAL | Status: DC
  Administered 2020-04-10 – 2020-04-11 (×2): 4 mg via ORAL
  Filled 2020-04-10 (×2): qty 1

## 2020-04-10 MED ORDER — PROMETHAZINE HCL 25 MG/ML IJ SOLN
INTRAMUSCULAR | Status: AC
  Administered 2020-04-10: 6.25 mg via INTRAVENOUS
  Filled 2020-04-10: qty 1

## 2020-04-10 MED ORDER — LOSARTAN POTASSIUM 50 MG PO TABS
100.0000 mg | ORAL_TABLET | Freq: Every morning | ORAL | Status: DC
  Administered 2020-04-10 – 2020-04-11 (×2): 100 mg via ORAL
  Filled 2020-04-10 (×2): qty 2

## 2020-04-10 MED ORDER — ACETAMINOPHEN 10 MG/ML IV SOLN
INTRAVENOUS | Status: AC
  Administered 2020-04-10: 1000 mg via INTRAVENOUS
  Filled 2020-04-10: qty 100

## 2020-04-10 MED ORDER — THROMBIN 5000 UNITS EX SOLR
CUTANEOUS | Status: AC
  Filled 2020-04-10: qty 10000

## 2020-04-10 MED ORDER — MIDAZOLAM HCL 2 MG/2ML IJ SOLN
INTRAMUSCULAR | Status: AC
  Filled 2020-04-10: qty 2

## 2020-04-10 MED ORDER — CEFAZOLIN SODIUM-DEXTROSE 2-4 GM/100ML-% IV SOLN
2.0000 g | INTRAVENOUS | Status: AC
  Administered 2020-04-10: 2 g via INTRAVENOUS
  Filled 2020-04-10: qty 100

## 2020-04-10 MED ORDER — OXYCODONE HCL ER 15 MG PO T12A
15.0000 mg | EXTENDED_RELEASE_TABLET | Freq: Two times a day (BID) | ORAL | Status: DC
  Administered 2020-04-10: 15 mg via ORAL
  Filled 2020-04-10: qty 1

## 2020-04-10 MED ORDER — LIDOCAINE HCL (CARDIAC) PF 100 MG/5ML IV SOSY
PREFILLED_SYRINGE | INTRAVENOUS | Status: DC | PRN
  Administered 2020-04-10: 100 mg via INTRAVENOUS

## 2020-04-10 MED ORDER — PROMETHAZINE HCL 25 MG/ML IJ SOLN: 6.2500 mg | Freq: Once | INTRAMUSCULAR | Status: AC | PRN

## 2020-04-10 MED ORDER — BUPIVACAINE HCL (PF) 0.25 % IJ SOLN
INTRAMUSCULAR | Status: AC
  Filled 2020-04-10: qty 30

## 2020-04-10 MED ORDER — SODIUM CHLORIDE 0.9% FLUSH: 3.0000 mL | INTRAVENOUS | Status: DC | PRN

## 2020-04-10 MED ORDER — SODIUM CHLORIDE 0.9 % IV SOLN: 250.0000 mL | INTRAVENOUS | Status: DC

## 2020-04-10 MED ORDER — OXYCODONE HCL 5 MG/5ML PO SOLN: 5.0000 mg | Freq: Once | ORAL | Status: DC | PRN

## 2020-04-10 MED ORDER — SUFENTANIL CITRATE 50 MCG/ML IV SOLN
INTRAVENOUS | Status: DC | PRN
  Administered 2020-04-10 (×2): 5 ug via INTRAVENOUS
  Administered 2020-04-10: 10 ug via INTRAVENOUS

## 2020-04-10 MED ORDER — LIDOCAINE-EPINEPHRINE 0.5 %-1:200000 IJ SOLN
INTRAMUSCULAR | Status: DC | PRN
  Administered 2020-04-10: 10 mL

## 2020-04-10 MED ORDER — ROCURONIUM 10MG/ML (10ML) SYRINGE FOR MEDFUSION PUMP - OPTIME
INTRAVENOUS | Status: DC | PRN
  Administered 2020-04-10: 50 mg via INTRAVENOUS

## 2020-04-10 MED ORDER — OXYCODONE HCL ER 10 MG PO T12A: 10.0000 mg | EXTENDED_RELEASE_TABLET | Freq: Two times a day (BID) | ORAL | Status: DC

## 2020-04-10 MED ORDER — PANTOPRAZOLE SODIUM 40 MG PO TBEC
40.0000 mg | DELAYED_RELEASE_TABLET | Freq: Every day | ORAL | Status: DC
  Administered 2020-04-10 – 2020-04-11 (×2): 40 mg via ORAL
  Filled 2020-04-10 (×2): qty 1

## 2020-04-10 MED ORDER — MAGNESIUM CITRATE PO SOLN: 1.0000 | Freq: Once | ORAL | Status: DC | PRN

## 2020-04-10 MED ORDER — DIAZEPAM 5 MG PO TABS: 5.0000 mg | ORAL_TABLET | Freq: Four times a day (QID) | ORAL | Status: DC | PRN

## 2020-04-10 MED ORDER — MENTHOL 3 MG MT LOZG: 1.0000 | LOZENGE | OROMUCOSAL | Status: DC | PRN

## 2020-04-10 MED ORDER — ACETAMINOPHEN 325 MG PO TABS
650.0000 mg | ORAL_TABLET | ORAL | Status: DC | PRN
  Administered 2020-04-10: 650 mg via ORAL
  Filled 2020-04-10: qty 2

## 2020-04-10 MED ORDER — ACETAMINOPHEN 10 MG/ML IV SOLN: 1000.0000 mg | Freq: Once | INTRAVENOUS | Status: DC | PRN

## 2020-04-10 MED ORDER — LACTATED RINGERS IV SOLN: INTRAVENOUS | Status: DC | PRN

## 2020-04-10 MED ORDER — HEMOSTATIC AGENTS (NO CHARGE) OPTIME
TOPICAL | Status: DC | PRN
  Administered 2020-04-10: 1 via TOPICAL

## 2020-04-10 MED ORDER — KETOROLAC TROMETHAMINE 15 MG/ML IJ SOLN
7.5000 mg | Freq: Four times a day (QID) | INTRAMUSCULAR | Status: DC
  Administered 2020-04-10 – 2020-04-11 (×3): 7.5 mg via INTRAVENOUS
  Filled 2020-04-10 (×3): qty 1

## 2020-04-10 MED ORDER — BISACODYL 5 MG PO TBEC: 5.0000 mg | DELAYED_RELEASE_TABLET | Freq: Every day | ORAL | Status: DC | PRN

## 2020-04-10 MED ORDER — 0.9 % SODIUM CHLORIDE (POUR BTL) OPTIME
TOPICAL | Status: DC | PRN
  Administered 2020-04-10: 1000 mL

## 2020-04-10 MED ORDER — LUBIPROSTONE 24 MCG PO CAPS
24.0000 ug | ORAL_CAPSULE | Freq: Every day | ORAL | Status: DC | PRN
  Filled 2020-04-10: qty 1

## 2020-04-10 MED ORDER — HYDROMORPHONE HCL 1 MG/ML IJ SOLN
0.2500 mg | INTRAMUSCULAR | Status: DC | PRN
  Administered 2020-04-10: 0.5 mg via INTRAVENOUS

## 2020-04-10 MED ORDER — OXYCODONE HCL 5 MG PO TABS
10.0000 mg | ORAL_TABLET | ORAL | Status: DC | PRN
  Administered 2020-04-10 – 2020-04-11 (×3): 10 mg via ORAL
  Filled 2020-04-10 (×3): qty 2

## 2020-04-10 MED ORDER — PROMETHAZINE HCL 25 MG/ML IJ SOLN: 12.5000 mg | Freq: Once | INTRAMUSCULAR | Status: DC | PRN

## 2020-04-10 MED ORDER — OXYCODONE HCL 5 MG PO TABS: 5.0000 mg | ORAL_TABLET | Freq: Once | ORAL | Status: DC | PRN

## 2020-04-10 SURGICAL SUPPLY — 53 items
ADH SKN CLS APL DERMABOND .7 (GAUZE/BANDAGES/DRESSINGS) ×1
APL SKNCLS STERI-STRIP NONHPOA (GAUZE/BANDAGES/DRESSINGS)
BAND INSRT 18 STRL LF DISP RB (MISCELLANEOUS)
BAND RUBBER #18 3X1/16 STRL (MISCELLANEOUS) ×2 IMPLANT
BENZOIN TINCTURE PRP APPL 2/3 (GAUZE/BANDAGES/DRESSINGS) IMPLANT
BLADE CLIPPER SURG (BLADE) IMPLANT
BUR MATCHSTICK NEURO 3.0 LAGG (BURR) ×2 IMPLANT
BUR PRECISION FLUTE 5.0 (BURR) ×1 IMPLANT
CANISTER SUCT 3000ML PPV (MISCELLANEOUS) ×2 IMPLANT
CARTRIDGE OIL MAESTRO DRILL (MISCELLANEOUS) ×1 IMPLANT
COVER WAND RF STERILE (DRAPES) ×2 IMPLANT
DECANTER SPIKE VIAL GLASS SM (MISCELLANEOUS) ×2 IMPLANT
DERMABOND ADVANCED (GAUZE/BANDAGES/DRESSINGS) ×1
DERMABOND ADVANCED .7 DNX12 (GAUZE/BANDAGES/DRESSINGS) ×1 IMPLANT
DIFFUSER DRILL AIR PNEUMATIC (MISCELLANEOUS) ×2 IMPLANT
DRAPE LAPAROTOMY 100X72X124 (DRAPES) ×2 IMPLANT
DRAPE MICROSCOPE LEICA (MISCELLANEOUS) ×1 IMPLANT
DRAPE SURG 17X23 STRL (DRAPES) ×2 IMPLANT
DURAPREP 26ML APPLICATOR (WOUND CARE) ×2 IMPLANT
ELECT REM PT RETURN 9FT ADLT (ELECTROSURGICAL) ×2
ELECTRODE REM PT RTRN 9FT ADLT (ELECTROSURGICAL) ×1 IMPLANT
GAUZE 4X4 16PLY RFD (DISPOSABLE) IMPLANT
GAUZE SPONGE 4X4 12PLY STRL (GAUZE/BANDAGES/DRESSINGS) IMPLANT
GLOVE BIOGEL PI IND STRL 7.0 (GLOVE) IMPLANT
GLOVE BIOGEL PI INDICATOR 7.0 (GLOVE) ×3
GLOVE ECLIPSE 6.5 STRL STRAW (GLOVE) ×2 IMPLANT
GLOVE EXAM NITRILE XL STR (GLOVE) IMPLANT
GLOVE SURG SS PI 7.5 STRL IVOR (GLOVE) ×1 IMPLANT
GOWN STRL REUS W/ TWL LRG LVL3 (GOWN DISPOSABLE) ×2 IMPLANT
GOWN STRL REUS W/ TWL XL LVL3 (GOWN DISPOSABLE) IMPLANT
GOWN STRL REUS W/TWL 2XL LVL3 (GOWN DISPOSABLE) IMPLANT
GOWN STRL REUS W/TWL LRG LVL3 (GOWN DISPOSABLE) ×2
GOWN STRL REUS W/TWL XL LVL3 (GOWN DISPOSABLE) ×2
KIT BASIN OR (CUSTOM PROCEDURE TRAY) ×2 IMPLANT
KIT TURNOVER KIT B (KITS) ×2 IMPLANT
NDL HYPO 25X1 1.5 SAFETY (NEEDLE) ×1 IMPLANT
NDL SPNL 18GX3.5 QUINCKE PK (NEEDLE) IMPLANT
NEEDLE HYPO 25X1 1.5 SAFETY (NEEDLE) ×2 IMPLANT
NEEDLE SPNL 18GX3.5 QUINCKE PK (NEEDLE) ×2 IMPLANT
NS IRRIG 1000ML POUR BTL (IV SOLUTION) ×2 IMPLANT
OIL CARTRIDGE MAESTRO DRILL (MISCELLANEOUS) ×2
PACK LAMINECTOMY NEURO (CUSTOM PROCEDURE TRAY) ×2 IMPLANT
PAD ARMBOARD 7.5X6 YLW CONV (MISCELLANEOUS) ×3 IMPLANT
SPONGE LAP 4X18 RFD (DISPOSABLE) IMPLANT
SPONGE SURGIFOAM ABS GEL SZ50 (HEMOSTASIS) ×2 IMPLANT
STRIP CLOSURE SKIN 1/2X4 (GAUZE/BANDAGES/DRESSINGS) IMPLANT
SUT VIC AB 0 CT1 18XCR BRD8 (SUTURE) ×1 IMPLANT
SUT VIC AB 0 CT1 8-18 (SUTURE) ×2
SUT VIC AB 2-0 CT1 18 (SUTURE) ×2 IMPLANT
SUT VIC AB 3-0 SH 8-18 (SUTURE) ×2 IMPLANT
TOWEL GREEN STERILE (TOWEL DISPOSABLE) ×2 IMPLANT
TOWEL GREEN STERILE FF (TOWEL DISPOSABLE) ×2 IMPLANT
WATER STERILE IRR 1000ML POUR (IV SOLUTION) ×2 IMPLANT

## 2020-04-10 NOTE — H&P (Signed)
BP (!) 179/95   Pulse 76   Temp 98.2 F (36.8 C) (Oral)   Resp 18   Ht 5' (1.524 m)   Wt 62.6 kg   SpO2 100%   BMI 26.95 kg/m     Erika Ward comes in today for evaluation of pain that she has in the lower back.     SOCIAL HISTORY :  She is retired, is 69 years of age, right-handed.  Does not smoke. Does not use alcohol. Without a history of substance abuse.     PAST MEDICAL HISTORY :  Hypertension, gastrointestinal problems, and she has undergone a cholecystectomy.     ALLERGIES :  She says she has an allergy to Vincent.  She did not provide a reaction.     FAMILY HISTORY :  Mother and father both deceased. Heart disease and cancer in the family history.       She is complaining of back pain in the right lower extremity, which started August 21st.  Says that medicine will help the pain.  The pain is 8/10 on a scale of 10.  Says the pain has gotten worse recently.  Her weight has been stable.  She reports night sweats, infections, cataracts, balance problems, anosmia, sinus problems, hypercholesterolemia, leg pain with walking and at rest, urinary tract infections, back pain, depression, inhalant allergies.  She also has diabetes mellitus.  To walk, she is hopping, unable to really bear weight on both lower extremities.  History of hypothyroidism and takes levothyroxine.     MEDICATIONS :  She takes Zetia, Losartan, Hydrochlorothiazide, Amitiza, MiraLAX, Vitamin D3, Promethazine, Baclofen, Alprazolam, Naprosyn, and Crestor.     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  This corresponds to the pain that she is having in the right lower extremity.  She has facet arthropathy at that level also, but the biggest problem without question is the cyst in the neuroforamen.  Conus is normal. Cauda equina is normal.  She has an old fracture at T12, treated with kyphoplasty by Dr. Carloyn Manner.     Erika Ward and I  spoke at great length.  She is in obvious distress.  Significant weakness in the right lower extremity.  She is hopping around.  She can barely tolerate me touching the leg.  Upper extremities have 5/5 strength. Left lower extremity is normal.  2+ reflexes at the knees and ankles bilaterally.  2+ biceps, triceps, brachioradialis.  Memory, language, attention span, and fund of knowledge are normal.  Speech is clear, it is also fluent. Hearing intact to voice.  She has symmetric facial movements.     PLAN :  I have strongly recommended that she undergo operative decompression of this.  I think we can get this done at [facility], and we will schedule the case there.  This will make it much easier for her, since she is going to be in [place] and that is home for her.  I will get this scheduled for next week.  I expect it to be a same-day operation.  She will go home later that day.  I did give her some medication oxycodone, today, for her pain.

## 2020-04-10 NOTE — Anesthesia Procedure Notes (Signed)
Procedure Name: Intubation Date/Time: 04/10/2020 2:24 PM Performed by: Claris Che, CRNA Pre-anesthesia Checklist: Patient identified, Emergency Drugs available, Suction available, Patient being monitored and Timeout performed Patient Re-evaluated:Patient Re-evaluated prior to induction Oxygen Delivery Method: Circle system utilized Preoxygenation: Pre-oxygenation with 100% oxygen Induction Type: IV induction and Cricoid Pressure applied Ventilation: Mask ventilation without difficulty Laryngoscope Size: Mac and 3 Grade View: Grade III Tube type: Oral Tube size: 7.5 mm Number of attempts: 2 Airway Equipment and Method: Stylet Placement Confirmation: ETT inserted through vocal cords under direct vision,  positive ETCO2 and breath sounds checked- equal and bilateral Secured at: 22 cm Tube secured with: Tape Dental Injury: Teeth and Oropharynx as per pre-operative assessment

## 2020-04-10 NOTE — Anesthesia Postprocedure Evaluation (Signed)
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  Last Vitals:  Vitals:   04/10/20 1000 04/10/20 1540  BP: (!) 179/95   Pulse: 76   Resp: 18 10  Temp: 36.8 C 36.5 C  SpO2: 100%     Last Pain:  Vitals:   04/10/20 1009  TempSrc:   PainSc: 8                  Barnet [place]

## 2020-04-10 NOTE — Transfer of Care (Signed)
Immediate Anesthesia Transfer of Care Note  Patient: FRANCI OSHANA  Procedure(s) Performed: Right Lumbar four-five Laminectomy for synovial cyst (Right )  Patient Location: PACU  Anesthesia Type:General  Level of Consciousness: awake, oriented, drowsy, patient cooperative and responds to stimulation  Airway & Oxygen Therapy: Patient Spontanous Breathing and Patient connected to nasal cannula oxygen  Post-op Assessment: Report given to RN, Post -op Vital signs reviewed and stable and Patient moving all extremities X 4  Post vital signs: Reviewed and stable  Last Vitals:  Vitals Value Taken Time  BP 153/83 04/10/20 1540  Temp    Pulse 85 04/10/20 1544  Resp 12 04/10/20 1544  SpO2 100 % 04/10/20 1544  Vitals shown include unvalidated device data.  Last Pain:  Vitals:   04/10/20 1009  TempSrc:   PainSc: 8       Patients Stated Pain Goal: 2 (33/54/56 2563)  Complications: No complications documented.

## 2020-04-11 ENCOUNTER — Encounter (HOSPITAL_COMMUNITY): Payer: Self-pay | Admitting: Neurosurgery

## 2020-04-11 DIAGNOSIS — E039 Hypothyroidism, unspecified: Secondary | ICD-10-CM | POA: Diagnosis not present

## 2020-04-11 DIAGNOSIS — E119 Type 2 diabetes mellitus without complications: Secondary | ICD-10-CM | POA: Diagnosis not present

## 2020-04-11 DIAGNOSIS — I10 Essential (primary) hypertension: Secondary | ICD-10-CM | POA: Diagnosis not present

## 2020-04-11 DIAGNOSIS — M7138 Other bursal cyst, other site: Secondary | ICD-10-CM | POA: Diagnosis not present

## 2020-04-11 LAB — GLUCOSE, CAPILLARY: Glucose-Capillary: 159 mg/dL — ABNORMAL HIGH (ref 70–99)

## 2020-04-11 MED ORDER — CYCLOBENZAPRINE HCL 10 MG PO TABS: 10.0000 mg | ORAL_TABLET | Freq: Three times a day (TID) | ORAL | 0 refills | Status: DC | PRN

## 2020-04-11 MED ORDER — OXYCODONE HCL 5 MG PO TABS: 5.0000 mg | ORAL_TABLET | ORAL | 0 refills | Status: DC | PRN

## 2020-04-11 NOTE — Discharge Summary (Signed)
Physician Discharge Summary  Patient ID: Erika Ward MRN: [id number] DOB/AGE: 17-Sep-1950 69 y.o.  Admit date: 04/10/2020 Discharge date: 04/11/2020  Admission Diagnoses:Synovial cyst right L4/5  Discharge Diagnoses: same Active Problems:   Synovial cyst of lumbar spine   Discharged Condition: good  Hospital Course: Erika Ward was admitted and taken to the operating room for an uncomplicated lumbar laminectomy and synovial cyst resection. Post op she voided, tolerated a regular diet, and ambulated. Her wound was clean, dry, and without signs of infection.   Treatments: surgery: as above  Discharge Exam: Blood pressure (!) 151/84, pulse 90, temperature 97.9 F (36.6 C), temperature source Oral, resp. rate 18, height 5' (1.524 m), weight 62.6 kg, SpO2 96 %. General appearance: alert, cooperative, appears stated age and mild distress  Disposition: Discharge disposition: 01-Home or Self Care      Synovial cyst of lumbar spine Discharge Instructions    Call MD for:  difficulty breathing, headache or visual disturbances   Complete by: As directed    Call MD for:  persistant dizziness or light-headedness   Complete by: As directed    Call MD for:  redness, tenderness, or signs of infection (pain, swelling, redness, odor or green/yellow discharge around incision site)   Complete by: As directed    Call MD for:  severe uncontrolled pain   Complete by: As directed    Call MD for:  temperature >100.4   Complete by: As directed    Diet - low sodium heart healthy   Complete by: As directed    Driving Restrictions   Complete by: As directed    Do not drive until given clearance.   Increase activity slowly   Complete by: As directed    Lifting restrictions   Complete by: As directed    Do not lift anything >10lbs. Avoid bending and twisting in awkward positions. Avoid bending at the back.   May shower / Bathe   Complete by: As directed    In 24 hours. Okay to wash wound  with warm soapy water. Avoid scrubbing the wound. Pat dry.   No wound care   Complete by: As directed      Allergies as of 04/11/2020      Reactions   Reglan [metoclopramide] Anxiety   Crestor [rosuvastatin] Other (See Comments)   Aching in legs   Norvasc [amlodipine Besylate] Other (See Comments)   Swells legs   Prozac [fluoxetine Hcl] Other (See Comments)   Made her nervous   Valsartan Other (See Comments)   Dehydrate,cramp   Zoloft [sertraline Hcl] Other (See Comments)   Shaking    Zofran [ondansetron Hcl] Rash      Medication List    STOP taking these medications   HYDROcodone-acetaminophen 5-325 MG tablet Commonly known as: NORCO/VICODIN     TAKE these medications   acetaminophen 500 MG tablet Commonly known as: TYLENOL Take 1,000 mg by mouth every 4 (four) hours as needed for headache (pain).   acyclovir 400 MG tablet Commonly known as: ZOVIRAX Take 400 mg by mouth every morning.   ALPRAZolam 1 MG tablet Commonly known as: XANAX Take 2 mg by mouth at bedtime.   BC HEADACHE POWDER PO Take 1 packet by mouth 3 (three) times daily as needed (severe headache).   CALTRATE 600+D PO Take 2 tablets by mouth every morning.   cyclobenzaprine 10 MG tablet Commonly known as: FLEXERIL Take 1 tablet (10 mg total) by mouth 3 (three) times daily as needed for  muscle spasms.   cycloSPORINE 0.05 % ophthalmic emulsion Commonly known as: RESTASIS Place 1 drop into both eyes 2 (two) times daily as needed (chronic dry eyes).   esomeprazole 40 MG capsule Commonly known as: NEXIUM Take 40 mg by mouth every morning.   ezetimibe 10 MG tablet Commonly known as: ZETIA Take 10 mg by mouth every morning.   fluticasone 50 MCG/ACT nasal spray Commonly known as: FLONASE Place 1 spray into both nostrils 3 (three) times daily as needed (congestion).   GAS RELIEF 125 MAX ST PO Take 125 mg by mouth daily as needed (gas/bloating).   levothyroxine 75 MCG tablet Commonly known as:  SYNTHROID Take 75 mcg by mouth every morning.   losartan 100 MG tablet Commonly known as: COZAAR Take 100 mg by mouth every morning.   lubiprostone 24 MCG capsule Commonly known as: AMITIZA Take 24 mcg by mouth See admin instructions. Take one tablet (24 mcg) by mouth 3-4 times weekly - for IBS   oxyCODONE 5 MG immediate release tablet Commonly known as: Oxy IR/ROXICODONE Take 1 tablet (5 mg total) by mouth every 4 (four) hours as needed for severe pain. What changed: when to take this   promethazine 25 MG tablet Commonly known as: PHENERGAN Take 25 mg by mouth every morning.   venlafaxine 75 MG tablet Commonly known as: EFFEXOR Take 75 mg by mouth every morning.       Follow-up Information    Ashok Pall, MD Follow up.   Specialty: Neurosurgery Contact information: [address] [address] [phone]        [facility] Follow up.   Specialty: Home Health Services Why: Your home health has been aset up with [facility]. The agency will contact you for start of service. If you have any questions please call number listed above.  Contact information: [address] [place] [address] [phone]               Signed: Ashok Pall 04/11/2020, 3:58 PM

## 2020-04-11 NOTE — Progress Notes (Signed)
Patient alert and oriented, mae's well, voiding adequate amount of urine, swallowing without difficulty, no c/o pain at time of discharge. Patient discharged home with family. Script and discharged instructions given to patient. Patient and family stated understanding of instructions given. Patient has an appointment with Dr. Cabbell   

## 2020-04-11 NOTE — Discharge Instructions (Signed)
Wound Care Leave incision open to air. You may shower. Do not scrub directly on incision.  Do not put any creams, lotions, or ointments on incision. Activity Walk each and every day, increasing distance each day. No lifting greater than 5 lbs.  Avoid bending, arching, and twisting. No driving for 2 weeks; may ride as a passenger locally. If provided with back brace, wear when out of bed.  It is not necessary to wear in bed. Diet Resume your normal diet.  Return to Work Will be discussed at you follow up appointment. Call Your Doctor If Any of These Occur Redness, drainage, or swelling at the wound.  Temperature greater than 101 degrees. Severe pain not relieved by pain medication. Incision starts to come apart. Follow Up Appt Call today for appointment in 4 weeks ([phone]) or for problems.  If you have any hardware placed in your spine, you will need an x-ray before your appointment.   Laminectomy, Care After This sheet gives you information about how to care for yourself after your procedure. Your health care provider may also give you more specific instructions. If you have problems or questions, contact your health care provider. What can I expect after the procedure? After the procedure, it is common to have:  Some pain around your incision area.  Muscle tightening (spasms) across the back. Follow these instructions at home: Medicines  Take over-the-counter and prescription medicines only as told by your health care provider.  If you were prescribed an antibiotic medicine, use it as told by your health care provider. Do not stop using the antibiotic even if you start to feel better.  If you are taking blood thinners: ? Talk with your health care provider before you take any medicines that contain aspirin or NSAIDs, such as ibuprofen. These medicines increase your risk for dangerous bleeding. ? Take your medicine exactly as told, at the same time every day. ? Avoid  activities that could cause injury or bruising, and follow instructions about how to prevent falls. ? Wear a medical alert bracelet or carry a card that lists what medicines you take.  Ask your health care provider if the medicine prescribed to you: ? Requires you to avoid driving or using heavy machinery. ? Can cause constipation. You may need to take these actions to prevent or treat constipation:  Drink enough fluid to keep your urine pale yellow.  Take over-the-counter or prescription medicines.  Eat foods that are high in fiber, such as beans, whole grains, and fresh fruits and vegetables.  Limit foods that are high in fat and processed sugars, such as fried or sweet foods. ? Do not drink alcohol if you are taking prescription pain medicine. Incision care   Follow instructions from your health care provider about how to take care of your incision area. Make sure you: ? Wash your hands with soap and water before and after you apply medicine to the area or change your bandage (dressing). If soap and water are not available, use hand sanitizer. ? Change your dressing as told by your health care provider. ? Leave stitches (sutures), skin glue, or adhesive strips in place. These skin closures may need to stay in place for 2 weeks or longer. If adhesive strip edges start to loosen and curl up, you may trim the loose edges. Do not remove adhesive strips completely unless your health care provider tells you to do that.  Check your incision area every day for signs of infection. Check for: ?  More redness, swelling, or pain. ? Fluid or blood. ? Warmth. ? Pus or a bad smell. Activity   Rest as told by your health care provider.  Avoid bending or twisting at your waist. Always bend at your knees.  Avoid sitting for a long time without moving. Get up to take short walks every 1-2 hours. This is important to improve blood flow and breathing. Ask for help if you feel weak or unsteady.  Do  not lift anything that is heavier than 10 lb (4.5 kg) or the limit that your health care provider tells you, until he or she says that it is safe.  Do exercises as told by your health care provider, including breathing exercises.  Return to your normal activities as told by your health care provider. Ask your health care provider what activities are safe for you. General instructions   Do not drive for 2 weeks after your procedure or for as long as told by your health care provider.  Do not take baths, swim, or use a hot tub for 2 weeks, or until your incision has healed completely. Ask your health care provider if you may take showers after your dressing has been removed. You may only be allowed to take sponge baths.  Do not use any products that contain nicotine or tobacco, such as cigarettes, e-cigarettes, and chewing tobacco. These can delay bone healing after surgery. If you need help quitting, ask your health care provider.  Wear compression stockings as told by your health care provider. These stockings help to prevent blood clots and reduce swelling in your legs.  Keep all follow-up visits as told by your health care provider. This is important. Contact a health care provider if:  You have more redness, swelling, or pain around your incision area.  Your incision feels warm to the touch.  You are not able to return to activities or do exercises as told by your health care provider. Get help right away if you:  Have any of these signs of infection: ? Fluid or blood coming from your incision area. ? Pus or a bad smell coming from your incision area. ? Chills or a fever.  Feel dizzy or you faint while standing.  Develop a rash.  Develop shortness of breath or you have difficulty breathing.  Cannot control when you urinate or have a bowel movement.  Become weak.  Are not able to use your legs. These symptoms may represent a serious problem that is an emergency. Do not  wait to see if the symptoms will go away. Get medical help right away. Call your local emergency services (911 in the [place] Do not drive yourself to the hospital. Summary  After the procedure, it is common to have some pain around your incision area. You may also have muscle tightening (spasms) across the back.  Follow instructions from your health care provider about how to care for your incision area.  Do not lift anything that is heavier than 10 lb (4.5 kg) or the limit that your health care provider tells you, until he or she says that it is safe.  Contact your health care provider if you have more redness, swelling, or pain around your incision area or if your incision feels warm to the touch. These can be signs of infection. This information is not intended to replace advice given to you by your health care provider. Make sure you discuss any questions you have with your health care provider. Document  Revised: 12/26/2018 Document Reviewed: 12/26/2018 Elsevier Patient Education  [place].

## 2020-04-11 NOTE — Progress Notes (Signed)
  NEUROSURGERY PROGRESS NOTE   No issues overnight.  Complains of appropriate back soreness Definite improvement in pre op RLE pain, although still some residual pain Able to ambulate with therapy using walker Voiding normal. Tolerating po. Eager for discharge  EXAM:  BP (!) 151/84 (BP Location: Right Arm)   Pulse 90   Temp 97.9 F (36.6 C) (Oral)   Resp 18   Ht 5' (1.524 m)   Wt 62.6 kg   SpO2 96%   BMI 26.95 kg/m   Awake, alert, oriented  Speech fluent, appropriate  CN grossly intact  5/5 BUE, LLE 4/4- RLE Incision: c/d/i  IMPRESSION/PLAN 69 y.o. female  POD1 R L4-5 lami for resection of synovial cyst. Improved pre op pain. Discharge home.

## 2020-04-11 NOTE — Evaluation (Signed)
Physical Therapy Evaluation Patient Details Name: Erika Ward MRN: [id number] DOB: 1950-06-26 Today's Date: 04/11/2020   History of Present Illness  Pt is a 69 yo female who presents s/p R L4-5 laminectomy for resection of synovial cyst. PMH significant for HTN, dysphasia, DM.   Clinical Impression  Pt admitted with above diagnosis. At the time of PT eval, pt was able to demonstrate transfers and ambulation with gross min guard assist to supervision for safety with a RW for support. Pt was educated on precautions, brace application/wearing schedule, appropriate activity progression, and car transfer. Pt currently with functional limitations due to the deficits listed below (see PT Problem List). Pt will benefit from skilled PT to increase their independence and safety with mobility to allow discharge to the venue listed below.      Follow Up Recommendations Home health PT;Supervision for mobility/OOB    Equipment Recommendations  None recommended by PT   Recommendations for Other Services       Precautions / Restrictions Precautions Precautions: Back Precaution Booklet Issued: Yes (comment) Precaution Comments: Reviewed handout in detail and pt was cued for precautions during functional mobility.  Restrictions Weight Bearing Restrictions: No      Mobility  Bed Mobility Overal bed mobility: Needs Assistance Bed Mobility: Rolling;Sidelying to Sit Rolling: Modified independent (Device/Increase time) Sidelying to sit: Supervision       General bed mobility comments: No assist to transition fully to EOB. Supervision for optimal log roll technique.     Transfers Overall transfer level: Needs assistance Equipment used: Rolling walker (2 wheeled) Transfers: Sit to/from [facility] Sit to Stand: Supervision Stand pivot transfers: Supervision       General transfer comment: VC's for hand placement on seated surface for safety. No assist to power-up to full  stand.  Ambulation/Gait Ambulation/Gait assistance: Min guard;Supervision Gait Distance (Feet): 300 Feet Assistive device: Rolling walker (2 wheeled) Gait Pattern/deviations: Step-through pattern;Decreased stride length;Trunk flexed Gait velocity: Decreased Gait velocity interpretation: <1.31 ft/sec, indicative of household ambulator General Gait Details: VC's for improved posture, closer walker proximity, and forward gaze. Pt was able to make corrective changes but required cues to maintain throughout gait training.   Stairs Stairs: Yes Stairs assistance: Min guard Stair Management: One rail Right Number of Stairs: 5 General stair comments: VC's for sequencing and general safety. Hands on guarding provided for safety.   Wheelchair Mobility    Modified Rankin (Stroke Patients Only)       Balance Overall balance assessment: Mild deficits observed, not formally tested                                           Pertinent Vitals/Pain Pain Assessment: Faces Faces Pain Scale: Hurts little more Pain Location: Incision site Pain Descriptors / Indicators: Operative site guarding Pain Intervention(s): Limited activity within patient's tolerance;Monitored during session;Repositioned    Home Living Family/patient expects to be discharged to:: Private residence Living Arrangements: Children Available Help at Discharge: Family;Available PRN/intermittently Type of Home: House Home Access: Stairs to enter Entrance Stairs-Rails: Psychiatric nurse of Steps: 5 Home Layout: One level Home Equipment: None      Prior Function Level of Independence: Needs assistance   Gait / Transfers Assistance Needed: Fairly independent around the mobile home without an AD  ADL's / Homemaking Assistance Needed: Sons assisting with cooking/cleaning and pt could bathe/dress herself.  Hand Dominance   Dominant Hand: Right    Extremity/Trunk Assessment    Upper Extremity Assessment Upper Extremity Assessment: Defer to OT evaluation RUE Deficits / Details: Abnormal movement RUE, however using funcitonally; Max difficulty tying shoes due to incoordinated movement of R hand.Pt states this is baseline RUE Coordination: decreased fine motor    Lower Extremity Assessment Lower Extremity Assessment: Generalized weakness (Consistent with pre-op diagnosis)    Cervical / Trunk Assessment Cervical / Trunk Assessment: Other exceptions (back sx)  Communication   Communication: No difficulties  Cognition Arousal/Alertness: Awake/alert Behavior During Therapy: WFL for tasks assessed/performed Overall Cognitive Status: Impaired/Different from baseline Area of Impairment: Attention;Memory;Safety/judgement;Awareness                   Current Attention Level: Selective Memory: Decreased short-term memory;Decreased recall of precautions   Safety/Judgement: Decreased awareness of safety;Decreased awareness of deficits Awareness: Emergent   General Comments: Assessed wtih the Short Blessed screen of memory adn concentration. Pt received a score of 6/28 indicating need for further assessment of cognition      General Comments      Exercises     Assessment/Plan    PT Assessment Patient needs continued PT services  PT Problem List Decreased strength;Decreased activity tolerance;Decreased balance;Decreased mobility;Decreased knowledge of use of DME;Decreased safety awareness;Decreased knowledge of precautions;Pain;Decreased cognition       PT Treatment Interventions DME instruction;Gait training;Stair training;Functional mobility training;Therapeutic activities;Therapeutic exercise;Neuromuscular re-education;Patient/family education    PT Goals (Current goals can be found in the Care Plan section)  Acute Rehab PT Goals Patient Stated Goal: to go home to her puppy PT Goal Formulation: With patient Time For Goal Achievement:  04/18/20 Potential to Achieve Goals: Good    Frequency Min 5X/week   Barriers to discharge        Co-evaluation               AM-PAC PT "6 Clicks" Mobility  Outcome Measure Help needed turning from your back to your side while in a flat bed without using bedrails?: None Help needed moving from lying on your back to sitting on the side of a flat bed without using bedrails?: A Little Help needed moving to and from a bed to a chair (including a wheelchair)?: A Little Help needed standing up from a chair using your arms (e.g., wheelchair or bedside chair)?: A Little Help needed to walk in hospital room?: A Little Help needed climbing 3-5 steps with a railing? : A Little 6 Click Score: 19    End of Session Equipment Utilized During Treatment: Gait belt Activity Tolerance: Patient tolerated treatment well Patient left: in bed;with call bell/phone within reach (Sitting EOB awaiting OT) Nurse Communication: Mobility status PT Visit Diagnosis: Unsteadiness on feet (R26.81);Pain    Time: 1093-2355 PT Time Calculation (min) (ACUTE ONLY): 30 min   Charges:   PT Evaluation $PT Eval Low Complexity: 1 Low PT Treatments $Gait Training: 8-22 mins        Rolinda Roan, PT, DPT Acute Rehabilitation Services Pager: [phone] Office: [phone]   Thelma Comp 04/11/2020, 1:21 PM

## 2020-04-11 NOTE — Discharge Summary (Signed)
Physician Discharge Summary  Patient ID: Erika Ward MRN: [id number] DOB/AGE: [birth year] 69 y.o.  Admit date: 04/10/2020 Discharge date: 04/11/2020  Admission Diagnoses:  Synovial cust lumbar spine, L4-5  Discharge Diagnoses:  Same Active Problems:   Synovial cyst of lumbar spine   Discharged Condition: Stable  Hospital Course:  Erika Ward is a 69 y.o. female who was admitted for the below procedure. There were no post operative complications. At time of discharge, pain was well controlled, ambulating with Pt/OT, tolerating po, voiding normal. Ready for discharge.  Treatments: Surgery - R L4-5 laminectomy for resection of synovial cyst  Discharge Exam: Blood pressure (!) 151/84, pulse 90, temperature 97.9 F (36.6 C), temperature source Oral, resp. rate 18, height 5' (1.524 m), weight 62.6 kg, SpO2 96 %. Awake, alert, oriented Speech fluent, appropriate CN grossly intact 5/5 BUE, LLE 4/4- RLE Wound c/d/i  Disposition: Discharge disposition: 01-Home or Self Care       Discharge Instructions    Call MD for:  difficulty breathing, headache or visual disturbances   Complete by: As directed    Call MD for:  persistant dizziness or light-headedness   Complete by: As directed    Call MD for:  redness, tenderness, or signs of infection (pain, swelling, redness, odor or green/yellow discharge around incision site)   Complete by: As directed    Call MD for:  severe uncontrolled pain   Complete by: As directed    Call MD for:  temperature >100.4   Complete by: As directed    Diet - low sodium heart healthy   Complete by: As directed    Driving Restrictions   Complete by: As directed    Do not drive until given clearance.   Increase activity slowly   Complete by: As directed    Lifting restrictions   Complete by: As directed    Do not lift anything >10lbs. Avoid bending and twisting in awkward positions. Avoid bending at the back.   May shower / Bathe    Complete by: As directed    In 24 hours. Okay to wash wound with warm soapy water. Avoid scrubbing the wound. Pat dry.   No wound care   Complete by: As directed      Allergies as of 04/11/2020      Reactions   Reglan [metoclopramide] Anxiety   Crestor [rosuvastatin] Other (See Comments)   Aching in legs   Norvasc [amlodipine Besylate] Other (See Comments)   Swells legs   Prozac [fluoxetine Hcl] Other (See Comments)   Made her nervous   Valsartan Other (See Comments)   Dehydrate,cramp   Zoloft [sertraline Hcl] Other (See Comments)   Shaking    Zofran [ondansetron Hcl] Rash      Medication List    STOP taking these medications   HYDROcodone-acetaminophen 5-325 MG tablet Commonly known as: NORCO/VICODIN     TAKE these medications   acetaminophen 500 MG tablet Commonly known as: TYLENOL Take 1,000 mg by mouth every 4 (four) hours as needed for headache (pain).   acyclovir 400 MG tablet Commonly known as: ZOVIRAX Take 400 mg by mouth every morning.   ALPRAZolam 1 MG tablet Commonly known as: XANAX Take 2 mg by mouth at bedtime.   BC HEADACHE POWDER PO Take 1 packet by mouth 3 (three) times daily as needed (severe headache).   CALTRATE 600+D PO Take 2 tablets by mouth every morning.   cyclobenzaprine 10 MG tablet Commonly known as: FLEXERIL Take 1  tablet (10 mg total) by mouth 3 (three) times daily as needed for muscle spasms.   cycloSPORINE 0.05 % ophthalmic emulsion Commonly known as: RESTASIS Place 1 drop into both eyes 2 (two) times daily as needed (chronic dry eyes).   esomeprazole 40 MG capsule Commonly known as: NEXIUM Take 40 mg by mouth every morning.   ezetimibe 10 MG tablet Commonly known as: ZETIA Take 10 mg by mouth every morning.   fluticasone 50 MCG/ACT nasal spray Commonly known as: FLONASE Place 1 spray into both nostrils 3 (three) times daily as needed (congestion).   GAS RELIEF 125 MAX ST PO Take 125 mg by mouth daily as needed  (gas/bloating).   levothyroxine 75 MCG tablet Commonly known as: SYNTHROID Take 75 mcg by mouth every morning.   losartan 100 MG tablet Commonly known as: COZAAR Take 100 mg by mouth every morning.   lubiprostone 24 MCG capsule Commonly known as: AMITIZA Take 24 mcg by mouth See admin instructions. Take one tablet (24 mcg) by mouth 3-4 times weekly - for IBS   oxyCODONE 5 MG immediate release tablet Commonly known as: Oxy IR/ROXICODONE Take 1 tablet (5 mg total) by mouth every 4 (four) hours as needed for severe pain. What changed: when to take this   promethazine 25 MG tablet Commonly known as: PHENERGAN Take 25 mg by mouth every morning.   venlafaxine 75 MG tablet Commonly known as: EFFEXOR Take 75 mg by mouth every morning.       Follow-up Information    Ashok Pall, MD Follow up.   Specialty: Neurosurgery Contact information: [address] [place] [address] [phone]               Signed: Traci Sermon 04/11/2020, 9:37 AM

## 2020-04-11 NOTE — TOC Transition Note (Signed)
Transition of Care [facility]) - CM/SW Discharge Note   Patient Details  Name: Erika Ward MRN: [id number] Date of Birth: Nov 23, 1950  [facility] CM/SW Contact:  Angelita Ingles, RN Phone Number: [phone]  04/11/2020, 2:03 PM   Clinical Narrative:    TOC consulted to set up home health. List of choices offered to patient. Home health has been set up with with [facility]. The agency will notify patient for start of service. Patient has been updated. No further needs noted at this time  TOC will sign off.    Final next level of care: [place] Barriers to Discharge: No Barriers Identified   Patient Goals and CMS Choice Patient states their goals for this hospitalization and ongoing recovery are:: Patient states she is ready to go home CMS Medicare.gov Compare Post Acute Care list provided to:: Patient Choice offered to / list presented to : Patient  Discharge Placement                       Discharge Plan and Services                DME Arranged: N/A DME Agency: NA (DME arranged per the unit with [employer])         [place] Agency: [facility] Date [place]: 04/11/20 Time [place]: 1833 Representative spoke with at [place]: Amy  Social Determinants of Health ([place]) Interventions     Readmission Risk Interventions No flowsheet data found.

## 2020-04-11 NOTE — Progress Notes (Signed)
Occupational Therapy Evaluation  Pt only remembered 2/3 back precautions from PT session. Only able to recall 2/3 back precautions after education/ several repetitions. Pt with slow processing and apparent STM deficits. Pt states that she is having more difficulty with her memory and sometimes "forgets to take her medication". Assessed with the Short Blessed screen for memory and concentration, receiving a score of 6/28, indicating the need for further cognitive assessment. Educated on back precautions for ADL and functional mobility for ADL. Requires min vc to adhere to precautions. Will greatly benefit from continued [place] direct S with ADL and tub transfers due to difficulty remembering precautions. Recommend pt follow up with her primary MD to further assess cognition and make appropriate referrals. Recommend pt refrain from driving. All further OT to be addressed by [facility].    04/11/20 1147  OT Visit Information  Last OT Received On 04/11/20  Assistance Needed +1  History of Present Illness 69 yo s/p R L4-5 laminectomy for resection of synovial cyst  Precautions  Precautions Back  Precaution Booklet Issued Yes (comment)  Restrictions  Weight Bearing Restrictions No  Home Living  Family/patient expects to be discharged to: Private residence  Living Arrangements Children  Available Help at Discharge Family;Available PRN/intermittently  Type of Home House  Home Access Stairs to enter  Entrance Stairs-Number of Steps 5  Entrance Stairs-Rails Right;Left  Home Layout One level  Bathroom Biomedical scientist Yes  How Accessible Accessible via walker  Home Equipment None  Prior Function  Level of Independence Needs assistance  Gait / Transfers Assistance Needed Fairly independent around the mobile home without an AD  ADL's / [place] assisting with cooking/cleaning and pt could bathe/dress herself.    Communication  Communication No difficulties  Pain Assessment  Pain Assessment Faces  Faces Pain Scale 4  Pain Location Incision site  Pain Descriptors / Indicators Operative site guarding  Pain Intervention(s) Limited activity within patient's tolerance;Monitored during session;Repositioned  Cognition  Arousal/Alertness Awake/alert  Behavior During Therapy WFL for tasks assessed/performed  Overall Cognitive Status Impaired/Different from baseline  Area of Impairment Attention;Memory;Safety/judgement;Awareness  Current Attention Level Selective  Memory Decreased short-term memory;Decreased recall of precautions  Safety/Judgement Decreased awareness of safety;Decreased awareness of deficits  Awareness Emergent  General Comments Assessed wtih the Short Blessed screen of memory adn concentration. Pt received a score of 6/28 indicating need for further assessment of cognition  Upper Extremity Assessment  Upper Extremity Assessment RUE deficits/detail  RUE Deficits / Details Abnormal movement RUE, however using funcitonally; Max difficulty tying shoes due to incoordinated movement of R hand.Pt states this is baseline  RUE Coordination decreased fine motor  Lower Extremity Assessment  Lower Extremity Assessment Defer to PT evaluation  Cervical / Trunk Assessment  Cervical / Trunk Assessment Other exceptions (back sx)  ADL  Overall ADL's  Needs assistance/impaired  Grooming Set up;Supervision/safety;Sitting  Upper Body Bathing Set up;Supervision/ safety;Sitting  Lower Body Bathing Minimal assistance;Sit to/from stand  Upper Body Dressing  Supervision/safety;Set up;Sitting  Lower Body Dressing Minimal assistance;Sit to/from Arboriculturist;Ambulation  Toileting- Forensic psychologist Min guard;Sit to/from stand  Functional mobility during ADLs Supervision/safety;Rolling walker;Cueing for safety;Cueing for sequencing  General ADL Comments Educate on  use of figure four technique to assist with LB ADL. Pt has difficulty donning L shoe; Requires mod cues to follow back precautions. Able to recall 2/3 back precautions after multiple repetitions  -will benefit from  using the 3in1 as a shower chair; educated on strategies to reduce risk of falls  Bed Mobility  General bed mobility comments sitting EOB  Transfers  Overall transfer level Needs assistance  Equipment used Rolling walker (2 wheeled)  Transfers Sit to/from [employer] Transfers  Sit to Stand Supervision  Stand pivot transfers Supervision  Balance  Overall balance assessment Mild deficits observed, not formally tested  OT - End of Session  Equipment Utilized During Treatment Rolling walker  Activity Tolerance Patient tolerated treatment well  Patient left in chair;with call bell/phone within reach  Nurse Communication Mobility status  OT Assessment  OT Recommendation/Assessment All further OT needs can be met in the next venue of care  OT Visit Diagnosis Unsteadiness on feet (R26.81);Muscle weakness (generalized) (M62.81);Other symptoms and signs involving cognitive function;Pain  Pain - part of body  (back)  OT Problem List Decreased strength;Decreased activity tolerance;Impaired balance (sitting and/or standing);Decreased cognition;Decreased safety awareness;Decreased knowledge of use of DME or AE;Pain  AM-PAC OT "6 Clicks" Daily Activity Outcome Measure (Version 2)  Help from another person eating meals? 4  Help from another person taking care of personal grooming? 3  Help from another person toileting, which includes using toliet, bedpan, or urinal? 3  Help from another person bathing (including washing, rinsing, drying)? 3  Help from another person to put on and taking off regular upper body clothing? 3  Help from another person to put on and taking off regular lower body clothing? 3  6 Click Score 19  OT Recommendation  Follow Up Recommendations Home health  OT;Supervision - Intermittent (needs direct S with ADL and medication management)  OT Equipment 3 in 1 bedside commode  Individuals Consulted  Consulted and Agree with Results and Recommendations Patient  Acute Rehab OT Goals  Patient Stated Goal to go home to her puppy  OT Goal Formulation All assessment and education complete, DC therapy  OT Time Calculation  OT Start Time (ACUTE ONLY) 0910  OT Stop Time (ACUTE ONLY) 0937  OT Time Calculation (min) 27 min  OT General Charges  $OT Visit 1 Visit  OT Evaluation  $OT Eval Low Complexity 1 Low  Written Expression  Dominant Hand Right   Maurie Boettcher, OT/L   Acute OT Clinical Specialist [place] Pager [phone] Office [phone]

## 2020-04-11 NOTE — Op Note (Signed)
04/10/2020  3:51 PM  PATIENT:  Erika Ward  69 y.o. female With a large right synovial cyst compressing the thecal sac and the L4,5 nerve roots. She has opted for operative decompression.  PRE-OPERATIVE DIAGNOSIS:  Synovial cyst of lumbar spine, right  L4/5  POST-OPERATIVE DIAGNOSIS:  Synovial cyst of lumbar spine, right L4/5  PROCEDURE:  Procedure(s): Right Lumbar four-five Laminectomy for synovial cyst  SURGEON:   Surgeon(s): Ashok Pall, MD  ASSISTANTS:none  ANESTHESIA:   general  EBL:  No intake/output data recorded.  BLOOD ADMINISTERED:none  CELL SAVER GIVEN:none  COUNT:per nursing  DRAINS: none   SPECIMEN:  No Specimen  DICTATION: [name] was taken to the operating room, intubated and placed under a general anesthetic without difficulty. She was positioned prone on a Wilson frame with all pressure points padded. Her back was prepped and draped in a sterile manner. I opened the skin with a 10 blade and carried the dissection down to the thoracolumbar fascia. I used both sharp dissection and the monopolar cautery to expose the lamina of L4, and L5. I confirmed my location with an intraoperative xray.  I used the drill, Kerrison punches, and curettes to perform a semihemilaminectomy of L4. I used the punches to remove the ligamentum flavum to expose the thecal sac. I brought the microscope into the operative field I started decompressing the spinal canal, thecal sac and L4,5 root(s). I inspected the L4, and L5 nerve root and felt it was well decompressed. I explored rostrally, laterally, medially, and caudally and was satisfied with the decompression. I irrigated the wound, then closed in layers. I approximated the thoracolumbar fascia, subcutaneous, and subcuticular planes with vicryl sutures. I used dermabond for a sterile dressing.   PLAN OF CARE: Admit for overnight observation  PATIENT DISPOSITION:  PACU - hemodynamically stable.   Delay start of  Pharmacological VTE agent (>24hrs) due to surgical blood loss or risk of bleeding:  yes

## 2020-04-13 DIAGNOSIS — E1165 Type 2 diabetes mellitus with hyperglycemia: Secondary | ICD-10-CM | POA: Diagnosis not present

## 2020-04-13 DIAGNOSIS — I10 Essential (primary) hypertension: Secondary | ICD-10-CM | POA: Diagnosis not present

## 2020-04-13 DIAGNOSIS — E039 Hypothyroidism, unspecified: Secondary | ICD-10-CM | POA: Diagnosis not present

## 2020-04-13 DIAGNOSIS — E7849 Other hyperlipidemia: Secondary | ICD-10-CM | POA: Diagnosis not present

## 2020-04-16 DIAGNOSIS — I10 Essential (primary) hypertension: Secondary | ICD-10-CM | POA: Diagnosis not present

## 2020-04-16 DIAGNOSIS — Z4789 Encounter for other orthopedic aftercare: Secondary | ICD-10-CM | POA: Diagnosis not present

## 2020-04-16 DIAGNOSIS — R2681 Unsteadiness on feet: Secondary | ICD-10-CM | POA: Diagnosis not present

## 2020-04-16 DIAGNOSIS — R531 Weakness: Secondary | ICD-10-CM | POA: Diagnosis not present

## 2020-04-16 DIAGNOSIS — E119 Type 2 diabetes mellitus without complications: Secondary | ICD-10-CM | POA: Diagnosis not present

## 2020-04-29 ENCOUNTER — Ambulatory Visit (INDEPENDENT_AMBULATORY_CARE_PROVIDER_SITE_OTHER): Payer: Medicare Other | Admitting: Gastroenterology

## 2020-05-01 DIAGNOSIS — R2681 Unsteadiness on feet: Secondary | ICD-10-CM | POA: Diagnosis not present

## 2020-05-01 DIAGNOSIS — E119 Type 2 diabetes mellitus without complications: Secondary | ICD-10-CM | POA: Diagnosis not present

## 2020-05-01 DIAGNOSIS — I10 Essential (primary) hypertension: Secondary | ICD-10-CM | POA: Diagnosis not present

## 2020-05-01 DIAGNOSIS — Z4789 Encounter for other orthopedic aftercare: Secondary | ICD-10-CM | POA: Diagnosis not present

## 2020-05-07 DIAGNOSIS — R531 Weakness: Secondary | ICD-10-CM | POA: Diagnosis not present

## 2020-05-07 DIAGNOSIS — Z4789 Encounter for other orthopedic aftercare: Secondary | ICD-10-CM | POA: Diagnosis not present

## 2020-05-07 DIAGNOSIS — E119 Type 2 diabetes mellitus without complications: Secondary | ICD-10-CM | POA: Diagnosis not present

## 2020-05-07 DIAGNOSIS — R2681 Unsteadiness on feet: Secondary | ICD-10-CM | POA: Diagnosis not present

## 2020-05-07 DIAGNOSIS — I10 Essential (primary) hypertension: Secondary | ICD-10-CM | POA: Diagnosis not present

## 2020-05-14 DIAGNOSIS — E039 Hypothyroidism, unspecified: Secondary | ICD-10-CM | POA: Diagnosis not present

## 2020-05-14 DIAGNOSIS — E7849 Other hyperlipidemia: Secondary | ICD-10-CM | POA: Diagnosis not present

## 2020-05-14 DIAGNOSIS — E119 Type 2 diabetes mellitus without complications: Secondary | ICD-10-CM | POA: Diagnosis not present

## 2020-05-14 DIAGNOSIS — R2681 Unsteadiness on feet: Secondary | ICD-10-CM | POA: Diagnosis not present

## 2020-05-14 DIAGNOSIS — Z4789 Encounter for other orthopedic aftercare: Secondary | ICD-10-CM | POA: Diagnosis not present

## 2020-05-14 DIAGNOSIS — R531 Weakness: Secondary | ICD-10-CM | POA: Diagnosis not present

## 2020-05-14 DIAGNOSIS — I10 Essential (primary) hypertension: Secondary | ICD-10-CM | POA: Diagnosis not present

## 2020-05-17 DIAGNOSIS — I10 Essential (primary) hypertension: Secondary | ICD-10-CM | POA: Diagnosis not present

## 2020-05-17 DIAGNOSIS — E119 Type 2 diabetes mellitus without complications: Secondary | ICD-10-CM | POA: Diagnosis not present

## 2020-05-17 DIAGNOSIS — Z4789 Encounter for other orthopedic aftercare: Secondary | ICD-10-CM | POA: Diagnosis not present

## 2020-05-17 DIAGNOSIS — R531 Weakness: Secondary | ICD-10-CM | POA: Diagnosis not present

## 2020-05-17 DIAGNOSIS — R2681 Unsteadiness on feet: Secondary | ICD-10-CM | POA: Diagnosis not present

## 2020-05-29 ENCOUNTER — Telehealth (INDEPENDENT_AMBULATORY_CARE_PROVIDER_SITE_OTHER): Payer: Medicare Other | Admitting: Gastroenterology

## 2020-05-29 ENCOUNTER — Other Ambulatory Visit: Payer: Self-pay

## 2020-05-29 ENCOUNTER — Encounter (INDEPENDENT_AMBULATORY_CARE_PROVIDER_SITE_OTHER): Payer: Self-pay | Admitting: Gastroenterology

## 2020-05-29 VITALS — Ht 60.0 in | Wt 127.0 lb

## 2020-05-29 DIAGNOSIS — R1319 Other dysphagia: Secondary | ICD-10-CM

## 2020-05-29 DIAGNOSIS — K219 Gastro-esophageal reflux disease without esophagitis: Secondary | ICD-10-CM | POA: Diagnosis not present

## 2020-05-29 DIAGNOSIS — K589 Irritable bowel syndrome without diarrhea: Secondary | ICD-10-CM | POA: Insufficient documentation

## 2020-05-29 DIAGNOSIS — K582 Mixed irritable bowel syndrome: Secondary | ICD-10-CM

## 2020-05-29 MED ORDER — LINACLOTIDE 72 MCG PO CAPS: 72.0000 ug | ORAL_CAPSULE | Freq: Every day | ORAL | 1 refills | Status: DC

## 2020-05-29 NOTE — Patient Instructions (Addendum)
Schedule EGD with SB biopsies and possible ED and colonoscopy with random colon biopsies Stop Amitiza, start Linzess 72 mcg qday Continue Nexium 40 mg qday

## 2020-05-29 NOTE — Progress Notes (Signed)
[name], M.D. Gastroenterology & Hepatology [facility] [address] [place], [place] [address] Primary Care Physician: Caryl Bis, MD [place] [id number]  This is a telephonic visit.  It required patient-provider interaction for the medical decision making as documented below. The patient has consented and agreed to proceed with a phone visit encounter given the current Coronavirus pandemic.  PHONE VISIT NOTE Patient location: Home Provider location: Office  I will communicate my assessment and recommendations to the referring MD via EMR. Note: Occasional unusual wording and randomly placed punctuation marks may result from the use of speech recognition technology to transcribe this document"  Chief Complaint:  dysphagia  History of Present Illness: Erika Ward is a 69 y.o. female with PMH anxiety, DM, depression, HTN, HLD, IBS, GERD s/p Nissen fundoplication, who presents for follow up of dysphagia and constipation  Patient is a poor historian and is very vague when I asked her questions regarding extent of symptoms or details. Many of her answers are "a long time ago, a while, all of that".  Patient reports that she has had intermittent episodes of dysphagia. She reports that the dysphagia has worsened recently , worse for solids but occasionally she symptoms when drinking liquids. Also has had to induce vomiting when food has not passed down, denied any odynophagia. She reports that she has had recurrent episodes of heartburn. She takes Nexium daily, which helps intermittently to relieve her symptoms.  She reports taking Amitiza for multiple years. The patient states having recurrent episodes of watery diarrhea, multiple times with fecal soiling. Does not take any antidiarrheals. She also has some episodes of constipation intermittently. She believes her symptoms are related to the Amitiza but she cannot  recall what has happened when she stops taking the medication.   Patient reports that she has also presented nausea and episodes of vomiting.  Believes has lost some weight but does not know how much.  The patient denies having any fever, chills, hematochezia, melena, hematemesis, abdominal distention, abdominal pain, diarrhea, jaundice, pruritus.  Last EGD: 2015  Short esophagus with tortuous distal segment. Esophageal mucosa was coated with thin film of food debris. None critical ring at GE junction. 4 cm size sliding hiatal hernia with wide-open hiatus. Antral scar but no evidence of active peptic ulcer disease. Distal esophagus dilated with balloon to 19 mm disrupting ring at GE junction. Biopsy taken from mucosa of the esophagus. Last Colonoscopy: >10 years ago per the patient, no report available.  Past Medical History: Past Medical History:  Diagnosis Date  . Anxiety   . Arthritis   . Chronic pain   . Complication of anesthesia    slow to wake up  . Depression   . Diabetes ([place])    Type 2 x 1 yr  . Dysphagia   . Fatty liver   . GERD (gastroesophageal reflux disease)   . High cholesterol   . Hypertension    1 yr  . Hypothyroid   . IBS (irritable bowel syndrome)   . Pneumonia   . Sleep apnea     Past Surgical History: Past Surgical History:  Procedure Laterality Date  . CHOLECYSTECTOMY    . ESOPHAGOGASTRODUODENOSCOPY N/A 04/02/2014   Procedure: ESOPHAGOGASTRODUODENOSCOPY (EGD);  Surgeon: Rogene Houston, MD;  [facility];  Service: Endoscopy;  Laterality: N/A;  730  . ESOPHAGOGASTRODUODENOSCOPY (EGD) WITH ESOPHAGEAL DILATION  05/18/2012   Procedure: ESOPHAGOGASTRODUODENOSCOPY (EGD) WITH ESOPHAGEAL DILATION;  Surgeon: Rogene Houston, MD;  [facility];  Service: Endoscopy;  Laterality: N/A;  320  . LUMBAR LAMINECTOMY/DECOMPRESSION MICRODISCECTOMY Right 04/10/2020   Procedure: Right Lumbar four-five Laminectomy for synovial cyst;  Surgeon:  Ashok Pall, MD;  Location: [place];  Service: Neurosurgery;  Laterality: Right;  . MALONEY DILATION N/A 04/02/2014   Procedure: Venia Minks DILATION;  Surgeon: Rogene Houston, MD;  Location: [facility];  Service: Endoscopy;  Laterality: N/A;  . [employer] Wrap    . PARTIAL HYSTERECTOMY    . Repair of small bowel at [facility]      Family History:History reviewed. No pertinent family history.  Social History: Social History   Tobacco Use  Smoking Status Former Smoker  . Packs/day: 0.50  . Years: 20.00  . Pack years: 10.00  . Types: Cigarettes  Smokeless Tobacco Never Used  Tobacco Comment   quit x 10 yrs ago   Social History   Substance and Sexual Activity  Alcohol Use No   Social History   Substance and Sexual Activity  Drug Use No    Allergies: Allergies  Allergen Reactions  . Reglan [Metoclopramide] Anxiety  . Crestor [Rosuvastatin] Other (See Comments)    Aching in legs  . Norvasc [Amlodipine Besylate] Other (See Comments)    Swells legs  . Prozac [Fluoxetine Hcl] Other (See Comments)    Made her nervous  . Valsartan Other (See Comments)    Dehydrate,cramp  . Zoloft [Sertraline Hcl] Other (See Comments)    Shaking   . Zofran [Ondansetron Hcl] Rash    Medications: Current Outpatient Medications  Medication Sig Dispense Refill  . acetaminophen (TYLENOL) 500 MG tablet Take 1,000 mg by mouth every 4 (four) hours as needed for headache (pain).    Marland Kitchen acyclovir (ZOVIRAX) 400 MG tablet Take 400 mg by mouth every morning.    Marland Kitchen ALPRAZolam (XANAX) 1 MG tablet Take 2 mg by mouth at bedtime.    . Calcium Carbonate-Vitamin D (CALTRATE 600+D PO) Take 2 tablets by mouth every morning.    . cyclobenzaprine (FLEXERIL) 10 MG tablet Take 1 tablet (10 mg total) by mouth 3 (three) times daily as needed for muscle spasms. 30 tablet 0  . cycloSPORINE (RESTASIS) 0.05 % ophthalmic emulsion Place 1 drop into both eyes 2 (two) times daily as needed (chronic dry eyes).    Marland Kitchen  esomeprazole (NEXIUM) 40 MG capsule Take 40 mg by mouth every morning.     . ezetimibe (ZETIA) 10 MG tablet Take 10 mg by mouth every morning.     . fluticasone (FLONASE) 50 MCG/ACT nasal spray Place 1 spray into both nostrils 3 (three) times daily as needed (congestion).     Marland Kitchen levothyroxine (SYNTHROID, LEVOTHROID) 75 MCG tablet Take 75 mcg by mouth every morning.     Marland Kitchen losartan (COZAAR) 100 MG tablet Take 100 mg by mouth every morning.    . lubiprostone (AMITIZA) 24 MCG capsule Take 24 mcg by mouth See admin instructions. Take one tablet (24 mcg) by mouth 3-4 times weekly - for IBS    . oxyCODONE (OXY IR/ROXICODONE) 5 MG immediate release tablet Take 1 tablet (5 mg total) by mouth every 4 (four) hours as needed for severe pain. 60 tablet 0  . promethazine (PHENERGAN) 25 MG tablet Take 25 mg by mouth every morning.     . Simethicone (GAS RELIEF 125 MAX ST PO) Take 125 mg by mouth daily as needed (gas/bloating).    . venlafaxine (EFFEXOR) 75  MG tablet Take 75 mg by mouth every morning.    . Aspirin-Salicylamide-Caffeine (BC HEADACHE POWDER PO) Take 1 packet by mouth 3 (three) times daily as needed (severe headache). (Patient not taking: Reported on 05/29/2020)     No current facility-administered medications for this visit.    Review of Systems: GENERAL: negative for malaise, night sweats HEENT: No changes in hearing or vision, no nose bleeds or other nasal problems. NECK: Negative for lumps, goiter, pain and significant neck swelling RESPIRATORY: Negative for cough, wheezing CARDIOVASCULAR: Negative for chest pain, leg swelling, palpitations, orthopnea GI: SEE HPI MUSCULOSKELETAL: Negative for joint pain or swelling, back pain, and muscle pain. SKIN: Negative for lesions, rash PSYCH: Negative for sleep disturbance, mood disorder and recent psychosocial stressors. HEMATOLOGY Negative for prolonged bleeding, bruising easily, and swollen nodes. ENDOCRINE: Negative for cold or heat intolerance,  polyuria, polydipsia and goiter. NEURO: negative for tremor, gait imbalance, syncope and seizures. The remainder of the review of systems is noncontributory.   Physical Exam: Not performed as this was a telephone encounter visit  Imaging/Labs: as above  I personally reviewed and interpreted the available labs, imaging and endoscopic files.  Impression and Plan: OLIA HINDERLITER is a 68 y.o. female with PMH anxiety, DM, depression, HTN, HLD, IBS, GERD s/p Nissen fundoplication, who presents for follow up of dysphagia and constipation. Patient has had symptoms which seem to be chronic in nature. Her dysphagia has been worsening recently for which evaluation with EGD and possible ED will be ordered. She had evidence of Schatzki's ring which was disrupted in the past. She will need to cotninue taking her Nexium as prior, as she has had improvement in GERD but also may prevent recurrent Shcatzki's. Finally, will switch he rAmitiza to Linzess 72 mcg as this can be titrated. Will order colonoscopy for both screening and diagnostic purposes to t/o microscopic colitis.  - Schedule EGD with SB biopsies and possible ED and colonoscopy with random colon biopsies - Stop Amitiza, start Linzess 72 mcg qday - Continue Nexium 40 mg qday - RTC 3 months  All questions were answered.      Total encounter time: I spent a total of 20 minutes  [name], MD Gastroenterology and Hepatology [facility]

## 2020-05-30 ENCOUNTER — Encounter (INDEPENDENT_AMBULATORY_CARE_PROVIDER_SITE_OTHER): Payer: Self-pay

## 2020-05-30 ENCOUNTER — Telehealth (INDEPENDENT_AMBULATORY_CARE_PROVIDER_SITE_OTHER): Payer: Self-pay

## 2020-05-30 ENCOUNTER — Other Ambulatory Visit (INDEPENDENT_AMBULATORY_CARE_PROVIDER_SITE_OTHER): Payer: Self-pay

## 2020-05-30 DIAGNOSIS — Z1211 Encounter for screening for malignant neoplasm of colon: Secondary | ICD-10-CM

## 2020-05-30 MED ORDER — NA SULFATE-K SULFATE-MG SULF 17.5-3.13-1.6 GM/177ML PO SOLN: 354.0000 mL | Freq: Once | ORAL | 0 refills | Status: AC

## 2020-05-30 NOTE — Telephone Encounter (Signed)
Erika Ward, CMA  

## 2020-06-04 DIAGNOSIS — M5126 Other intervertebral disc displacement, lumbar region: Secondary | ICD-10-CM | POA: Diagnosis not present

## 2020-06-04 DIAGNOSIS — R937 Abnormal findings on diagnostic imaging of other parts of musculoskeletal system: Secondary | ICD-10-CM | POA: Diagnosis not present

## 2020-06-04 DIAGNOSIS — M47816 Spondylosis without myelopathy or radiculopathy, lumbar region: Secondary | ICD-10-CM | POA: Diagnosis not present

## 2020-06-04 DIAGNOSIS — Z9889 Other specified postprocedural states: Secondary | ICD-10-CM | POA: Diagnosis not present

## 2020-06-04 DIAGNOSIS — M48061 Spinal stenosis, lumbar region without neurogenic claudication: Secondary | ICD-10-CM | POA: Diagnosis not present

## 2020-06-04 DIAGNOSIS — M47817 Spondylosis without myelopathy or radiculopathy, lumbosacral region: Secondary | ICD-10-CM | POA: Diagnosis not present

## 2020-06-04 DIAGNOSIS — M7989 Other specified soft tissue disorders: Secondary | ICD-10-CM | POA: Diagnosis not present

## 2020-06-06 DIAGNOSIS — R2681 Unsteadiness on feet: Secondary | ICD-10-CM | POA: Diagnosis not present

## 2020-06-06 DIAGNOSIS — R531 Weakness: Secondary | ICD-10-CM | POA: Diagnosis not present

## 2020-06-06 DIAGNOSIS — I10 Essential (primary) hypertension: Secondary | ICD-10-CM | POA: Diagnosis not present

## 2020-06-06 DIAGNOSIS — Z4789 Encounter for other orthopedic aftercare: Secondary | ICD-10-CM | POA: Diagnosis not present

## 2020-06-06 DIAGNOSIS — E119 Type 2 diabetes mellitus without complications: Secondary | ICD-10-CM | POA: Diagnosis not present

## 2020-06-14 DIAGNOSIS — I10 Essential (primary) hypertension: Secondary | ICD-10-CM | POA: Diagnosis not present

## 2020-06-14 DIAGNOSIS — E7849 Other hyperlipidemia: Secondary | ICD-10-CM | POA: Diagnosis not present

## 2020-06-14 DIAGNOSIS — E039 Hypothyroidism, unspecified: Secondary | ICD-10-CM | POA: Diagnosis not present

## 2020-06-14 DIAGNOSIS — E1165 Type 2 diabetes mellitus with hyperglycemia: Secondary | ICD-10-CM | POA: Diagnosis not present

## 2020-06-25 ENCOUNTER — Telehealth (INDEPENDENT_AMBULATORY_CARE_PROVIDER_SITE_OTHER): Payer: Self-pay | Admitting: Gastroenterology

## 2020-06-25 NOTE — Telephone Encounter (Signed)
Patient called stated she needs to cancel her procedure - she just had back surgery - would like to reschedule - please advise - ph# [phone]

## 2020-06-26 DIAGNOSIS — Z20822 Contact with and (suspected) exposure to covid-19: Secondary | ICD-10-CM | POA: Diagnosis not present

## 2020-06-26 DIAGNOSIS — R059 Cough, unspecified: Secondary | ICD-10-CM | POA: Diagnosis not present

## 2020-07-01 ENCOUNTER — Other Ambulatory Visit (HOSPITAL_COMMUNITY): Payer: Medicare Other

## 2020-07-02 ENCOUNTER — Ambulatory Visit (HOSPITAL_COMMUNITY): Admit: 2020-07-02 | Payer: Medicare Other | Admitting: Gastroenterology

## 2020-07-02 ENCOUNTER — Encounter (HOSPITAL_COMMUNITY): Payer: Self-pay

## 2020-07-02 SURGERY — COLONOSCOPY WITH PROPOFOL
Anesthesia: Monitor Anesthesia Care

## 2020-07-04 DIAGNOSIS — M79605 Pain in left leg: Secondary | ICD-10-CM | POA: Diagnosis not present

## 2020-07-04 DIAGNOSIS — M25552 Pain in left hip: Secondary | ICD-10-CM | POA: Diagnosis not present

## 2020-07-13 DIAGNOSIS — E7849 Other hyperlipidemia: Secondary | ICD-10-CM | POA: Diagnosis not present

## 2020-07-13 DIAGNOSIS — E1165 Type 2 diabetes mellitus with hyperglycemia: Secondary | ICD-10-CM | POA: Diagnosis not present

## 2020-07-13 DIAGNOSIS — E039 Hypothyroidism, unspecified: Secondary | ICD-10-CM | POA: Diagnosis not present

## 2020-07-13 DIAGNOSIS — I10 Essential (primary) hypertension: Secondary | ICD-10-CM | POA: Diagnosis not present

## 2020-09-02 DIAGNOSIS — K2100 Gastro-esophageal reflux disease with esophagitis, without bleeding: Secondary | ICD-10-CM | POA: Diagnosis not present

## 2020-09-02 DIAGNOSIS — E782 Mixed hyperlipidemia: Secondary | ICD-10-CM | POA: Diagnosis not present

## 2020-09-02 DIAGNOSIS — I10 Essential (primary) hypertension: Secondary | ICD-10-CM | POA: Diagnosis not present

## 2020-09-02 DIAGNOSIS — E1165 Type 2 diabetes mellitus with hyperglycemia: Secondary | ICD-10-CM | POA: Diagnosis not present

## 2020-09-02 DIAGNOSIS — E039 Hypothyroidism, unspecified: Secondary | ICD-10-CM | POA: Diagnosis not present

## 2020-09-02 DIAGNOSIS — Z1159 Encounter for screening for other viral diseases: Secondary | ICD-10-CM | POA: Diagnosis not present

## 2020-09-02 DIAGNOSIS — E7849 Other hyperlipidemia: Secondary | ICD-10-CM | POA: Diagnosis not present

## 2020-09-04 ENCOUNTER — Ambulatory Visit (INDEPENDENT_AMBULATORY_CARE_PROVIDER_SITE_OTHER): Payer: Medicare Other | Admitting: Gastroenterology

## 2020-09-11 DIAGNOSIS — I10 Essential (primary) hypertension: Secondary | ICD-10-CM | POA: Diagnosis not present

## 2020-09-11 DIAGNOSIS — E1165 Type 2 diabetes mellitus with hyperglycemia: Secondary | ICD-10-CM | POA: Diagnosis not present

## 2020-09-11 DIAGNOSIS — E039 Hypothyroidism, unspecified: Secondary | ICD-10-CM | POA: Diagnosis not present

## 2020-09-11 DIAGNOSIS — E7849 Other hyperlipidemia: Secondary | ICD-10-CM | POA: Diagnosis not present

## 2020-10-12 DIAGNOSIS — E039 Hypothyroidism, unspecified: Secondary | ICD-10-CM | POA: Diagnosis not present

## 2020-10-12 DIAGNOSIS — E1165 Type 2 diabetes mellitus with hyperglycemia: Secondary | ICD-10-CM | POA: Diagnosis not present

## 2020-10-12 DIAGNOSIS — I10 Essential (primary) hypertension: Secondary | ICD-10-CM | POA: Diagnosis not present

## 2020-10-12 DIAGNOSIS — E7849 Other hyperlipidemia: Secondary | ICD-10-CM | POA: Diagnosis not present

## 2020-11-05 DIAGNOSIS — R059 Cough, unspecified: Secondary | ICD-10-CM | POA: Diagnosis not present

## 2020-11-05 DIAGNOSIS — J069 Acute upper respiratory infection, unspecified: Secondary | ICD-10-CM | POA: Diagnosis not present

## 2020-11-05 DIAGNOSIS — R509 Fever, unspecified: Secondary | ICD-10-CM | POA: Diagnosis not present

## 2020-11-11 DIAGNOSIS — J449 Chronic obstructive pulmonary disease, unspecified: Secondary | ICD-10-CM | POA: Diagnosis not present

## 2020-11-11 DIAGNOSIS — M5137 Other intervertebral disc degeneration, lumbosacral region: Secondary | ICD-10-CM | POA: Diagnosis not present

## 2020-11-11 DIAGNOSIS — I10 Essential (primary) hypertension: Secondary | ICD-10-CM | POA: Diagnosis not present

## 2020-11-11 DIAGNOSIS — E7849 Other hyperlipidemia: Secondary | ICD-10-CM | POA: Diagnosis not present

## 2020-11-11 DIAGNOSIS — Z720 Tobacco use: Secondary | ICD-10-CM | POA: Diagnosis not present

## 2020-11-20 ENCOUNTER — Encounter (INDEPENDENT_AMBULATORY_CARE_PROVIDER_SITE_OTHER): Payer: Self-pay | Admitting: *Deleted

## 2020-11-20 DIAGNOSIS — Z0001 Encounter for general adult medical examination with abnormal findings: Secondary | ICD-10-CM | POA: Diagnosis not present

## 2020-11-20 DIAGNOSIS — Z23 Encounter for immunization: Secondary | ICD-10-CM | POA: Diagnosis not present

## 2020-11-20 DIAGNOSIS — R4582 Worries: Secondary | ICD-10-CM | POA: Diagnosis not present

## 2020-11-20 DIAGNOSIS — K580 Irritable bowel syndrome with diarrhea: Secondary | ICD-10-CM | POA: Diagnosis not present

## 2020-11-20 DIAGNOSIS — I10 Essential (primary) hypertension: Secondary | ICD-10-CM | POA: Diagnosis not present

## 2020-11-20 DIAGNOSIS — M797 Fibromyalgia: Secondary | ICD-10-CM | POA: Diagnosis not present

## 2020-11-20 DIAGNOSIS — E1165 Type 2 diabetes mellitus with hyperglycemia: Secondary | ICD-10-CM | POA: Diagnosis not present

## 2020-12-05 ENCOUNTER — Ambulatory Visit (INDEPENDENT_AMBULATORY_CARE_PROVIDER_SITE_OTHER): Payer: Medicare Other | Admitting: Gastroenterology

## 2020-12-05 DIAGNOSIS — H26493 Other secondary cataract, bilateral: Secondary | ICD-10-CM | POA: Diagnosis not present

## 2020-12-12 DIAGNOSIS — E7849 Other hyperlipidemia: Secondary | ICD-10-CM | POA: Diagnosis not present

## 2020-12-12 DIAGNOSIS — E039 Hypothyroidism, unspecified: Secondary | ICD-10-CM | POA: Diagnosis not present

## 2020-12-12 DIAGNOSIS — E1165 Type 2 diabetes mellitus with hyperglycemia: Secondary | ICD-10-CM | POA: Diagnosis not present

## 2020-12-12 DIAGNOSIS — I10 Essential (primary) hypertension: Secondary | ICD-10-CM | POA: Diagnosis not present

## 2021-01-12 DIAGNOSIS — I10 Essential (primary) hypertension: Secondary | ICD-10-CM | POA: Diagnosis not present

## 2021-01-12 DIAGNOSIS — E7849 Other hyperlipidemia: Secondary | ICD-10-CM | POA: Diagnosis not present

## 2021-01-12 DIAGNOSIS — E039 Hypothyroidism, unspecified: Secondary | ICD-10-CM | POA: Diagnosis not present

## 2021-01-12 DIAGNOSIS — E1165 Type 2 diabetes mellitus with hyperglycemia: Secondary | ICD-10-CM | POA: Diagnosis not present

## 2021-02-12 DIAGNOSIS — I10 Essential (primary) hypertension: Secondary | ICD-10-CM | POA: Diagnosis not present

## 2021-02-12 DIAGNOSIS — E7849 Other hyperlipidemia: Secondary | ICD-10-CM | POA: Diagnosis not present

## 2021-02-12 DIAGNOSIS — E039 Hypothyroidism, unspecified: Secondary | ICD-10-CM | POA: Diagnosis not present

## 2021-02-12 DIAGNOSIS — E1165 Type 2 diabetes mellitus with hyperglycemia: Secondary | ICD-10-CM | POA: Diagnosis not present

## 2021-03-05 DIAGNOSIS — I10 Essential (primary) hypertension: Secondary | ICD-10-CM | POA: Diagnosis not present

## 2021-03-05 DIAGNOSIS — E7849 Other hyperlipidemia: Secondary | ICD-10-CM | POA: Diagnosis not present

## 2021-03-05 DIAGNOSIS — E119 Type 2 diabetes mellitus without complications: Secondary | ICD-10-CM | POA: Diagnosis not present

## 2021-03-05 DIAGNOSIS — E039 Hypothyroidism, unspecified: Secondary | ICD-10-CM | POA: Diagnosis not present

## 2021-03-05 DIAGNOSIS — K2100 Gastro-esophageal reflux disease with esophagitis, without bleeding: Secondary | ICD-10-CM | POA: Diagnosis not present

## 2021-03-05 DIAGNOSIS — E782 Mixed hyperlipidemia: Secondary | ICD-10-CM | POA: Diagnosis not present

## 2021-03-06 DIAGNOSIS — Z1231 Encounter for screening mammogram for malignant neoplasm of breast: Secondary | ICD-10-CM | POA: Diagnosis not present

## 2021-03-10 DIAGNOSIS — Z23 Encounter for immunization: Secondary | ICD-10-CM | POA: Diagnosis not present

## 2021-03-10 DIAGNOSIS — K580 Irritable bowel syndrome with diarrhea: Secondary | ICD-10-CM | POA: Diagnosis not present

## 2021-03-10 DIAGNOSIS — I10 Essential (primary) hypertension: Secondary | ICD-10-CM | POA: Diagnosis not present

## 2021-03-10 DIAGNOSIS — R4582 Worries: Secondary | ICD-10-CM | POA: Diagnosis not present

## 2021-03-10 DIAGNOSIS — E039 Hypothyroidism, unspecified: Secondary | ICD-10-CM | POA: Diagnosis not present

## 2021-03-10 DIAGNOSIS — E1165 Type 2 diabetes mellitus with hyperglycemia: Secondary | ICD-10-CM | POA: Diagnosis not present

## 2021-03-10 DIAGNOSIS — K2100 Gastro-esophageal reflux disease with esophagitis, without bleeding: Secondary | ICD-10-CM | POA: Diagnosis not present

## 2021-03-31 ENCOUNTER — Encounter (INDEPENDENT_AMBULATORY_CARE_PROVIDER_SITE_OTHER): Payer: Self-pay

## 2021-03-31 ENCOUNTER — Ambulatory Visit (INDEPENDENT_AMBULATORY_CARE_PROVIDER_SITE_OTHER): Payer: Medicare Other | Admitting: Gastroenterology

## 2021-03-31 ENCOUNTER — Encounter (INDEPENDENT_AMBULATORY_CARE_PROVIDER_SITE_OTHER): Payer: Self-pay | Admitting: Gastroenterology

## 2021-03-31 ENCOUNTER — Other Ambulatory Visit: Payer: Self-pay

## 2021-03-31 ENCOUNTER — Telehealth (INDEPENDENT_AMBULATORY_CARE_PROVIDER_SITE_OTHER): Payer: Self-pay

## 2021-03-31 ENCOUNTER — Other Ambulatory Visit (INDEPENDENT_AMBULATORY_CARE_PROVIDER_SITE_OTHER): Payer: Self-pay

## 2021-03-31 VITALS — BP 173/97 | HR 84 | Temp 98.1°F | Ht 60.0 in | Wt 138.2 lb

## 2021-03-31 DIAGNOSIS — R140 Abdominal distension (gaseous): Secondary | ICD-10-CM

## 2021-03-31 DIAGNOSIS — R1319 Other dysphagia: Secondary | ICD-10-CM

## 2021-03-31 DIAGNOSIS — K582 Mixed irritable bowel syndrome: Secondary | ICD-10-CM | POA: Diagnosis not present

## 2021-03-31 MED ORDER — PEG 3350-KCL-NA BICARB-NACL 420 G PO SOLR: 4000.0000 mL | ORAL | 0 refills | Status: DC

## 2021-03-31 NOTE — Telephone Encounter (Signed)
Erika Ward, CMA  

## 2021-03-31 NOTE — H&P (View-Only) (Signed)
Erika Ward, M.D. Gastroenterology & Hepatology [facility] [address] [place], [place] [address]  Primary Care Physician: Caryl Bis, MD [place] [id number]  I will communicate my assessment and recommendations to the referring MD via EMR.  Problems: IBS-M Chronic abdominal pain Chronic nausea  History of Present Illness: Erika Ward is a 70 y.o. female with PMH anxiety, DM, depression, HTN, HLD, IBS, GERD s/p Nissen fundoplication, who presents for follow up of dysphagia and abdominal pain.  The patient was last seen on 05/29/2020. At that time, the patient was scheduled for an EGD and a colonoscopy, but she canceled this procedure so she had back surgery.  She was also advised to stop taking Amitiza and was told to take Linzess 72 mcg every day.  Was also advised to continue taking Nexium 40 mg daily.  The patient reports she has presented gastrointestinal symptoms for the last 2 years, she believes.  She does not know exactly when her chronic gastrointestinal diseases started but she remembers that she was told that she had IBS since the late 1980s as she had some irregularity in her bowel movements and abdominal pain.  She reports that she has vertigo and she usually has significant nausea and dizziness, for which she takes initially Phenergan. If this does not work, she takes another medication for vertigo, but she does not feel it help as much as Phenergan. She takes Phenergan on a daily basis, has been getting these refills by her PCP.  She also reports that 2 years ago she has presented frequent diarrhea. She describes having frequent episodes of diarrhea described between soft to watery in consistency, which she describes as multiple Bms on a day. Some days she has constipation that can last up to 5 days. She reports that she takes Linzess every day, but if constipated she takes a stool softener and  Miralax. She reports having frequent episodes of fecal soiling due to urgency but no incontinence.  She reports that most of the days of the month she has diarrhea.  She has presented intermittent dysphagia for multiple years, which is not related to a specific type of food. It mostly happens with solids, but sometimes happens with liquids. Sometimes she feels the food is not coming down and has to vomit the food. She has heartburn almost three times a week, for which she takes Nexium, usually before breakfast.  She also reports having frequent bloating and discomfort, with generalized frequent pain.   The patient denies having any fever, chills, hematochezia, melena, hematemesis, jaundice, pruritus or weight loss (she reports she has been gaining weight).  Last EGD: 2015  Short esophagus with tortuous distal segment. Esophageal mucosa was coated with thin film of food debris. None critical ring at GE junction. 4 cm size sliding hiatal hernia with wide-open hiatus. Antral scar but no evidence of active peptic ulcer disease. Distal esophagus dilated with balloon to 19 mm disrupting ring at GE junction. Biopsy taken from mucosa of the esophagus. Last Colonoscopy: >10 years ago per the patient, no report available.  Past Medical History: Past Medical History:  Diagnosis Date   Anxiety    Arthritis    Chronic pain    Complication of anesthesia    slow to wake up   Depression    Diabetes ([place])    Type 2 x 1 yr   Dysphagia    Fatty liver    GERD (gastroesophageal reflux  disease)    High cholesterol    Hypertension    1 yr   Hypothyroid    IBS (irritable bowel syndrome)    Pneumonia    Sleep apnea     Past Surgical History: Past Surgical History:  Procedure Laterality Date   CHOLECYSTECTOMY     ESOPHAGOGASTRODUODENOSCOPY N/A 04/02/2014   Procedure: ESOPHAGOGASTRODUODENOSCOPY (EGD);  Surgeon: Rogene Houston, MD;  [facility];  Service: Endoscopy;  Laterality: N/A;   730   ESOPHAGOGASTRODUODENOSCOPY (EGD) WITH ESOPHAGEAL DILATION  05/18/2012   Procedure: ESOPHAGOGASTRODUODENOSCOPY (EGD) WITH ESOPHAGEAL DILATION;  Surgeon: Rogene Houston, MD;  [facility];  Service: Endoscopy;  Laterality: N/A;  320   LUMBAR LAMINECTOMY/DECOMPRESSION MICRODISCECTOMY Right 04/10/2020   Procedure: Right Lumbar four-five Laminectomy for synovial cyst;  Surgeon: Ashok Pall, MD;  Location: [place];  Service: Neurosurgery;  Laterality: Right;   MALONEY DILATION N/A 04/02/2014   Procedure: Venia Minks DILATION;  Surgeon: Rogene Houston, MD;  [facility];  Service: Endoscopy;  Laterality: N/A;   Nissan Wrap     PARTIAL HYSTERECTOMY     Repair of small bowel at [facility]      Family History:History reviewed. No pertinent family history.  Social History: Social History   Tobacco Use  Smoking Status Former   Packs/day: 0.50   Years: 20.00   Pack years: 10.00   Types: Cigarettes  Smokeless Tobacco Never  Tobacco Comments   quit x 10 yrs ago   Social History   Substance and Sexual Activity  Alcohol Use Yes   Comment: occasionally   Social History   Substance and Sexual Activity  Drug Use No    Allergies: Allergies  Allergen Reactions   Reglan [Metoclopramide] Anxiety   Crestor [Rosuvastatin] Other (See Comments)    Aching in legs   Norvasc [Amlodipine Besylate] Other (See Comments)    Swells legs   Prozac [Fluoxetine Hcl] Other (See Comments)    Made her nervous   Valsartan Other (See Comments)    Dehydrate,cramp   Zoloft [Sertraline Hcl] Other (See Comments)    Shaking    Zofran [Ondansetron Hcl] Rash    Medications: Current Outpatient Medications  Medication Sig Dispense Refill   acyclovir (ZOVIRAX) 400 MG tablet Take 400 mg by mouth every morning.     ALPRAZolam (XANAX) 1 MG tablet Take 2 mg by mouth at bedtime.     Aspirin-Salicylamide-Caffeine (BC HEADACHE POWDER PO) Take 1 packet by mouth 3 (three) times daily  as needed (severe headache).     Calcium Carbonate-Vitamin D (CALTRATE 600+D PO) Take 2 tablets by mouth every morning.     cyclobenzaprine (FLEXERIL) 10 MG tablet Take 1 tablet (10 mg total) by mouth 3 (three) times daily as needed for muscle spasms. 30 tablet 0   esomeprazole (NEXIUM) 40 MG capsule Take 40 mg by mouth every morning.      ezetimibe (ZETIA) 10 MG tablet Take 10 mg by mouth every morning.      fluticasone (FLONASE) 50 MCG/ACT nasal spray Place 1 spray into both nostrils 3 (three) times daily as needed (congestion).      levothyroxine (SYNTHROID, LEVOTHROID) 75 MCG tablet Take 75 mcg by mouth every morning.      losartan (COZAAR) 100 MG tablet Take 100 mg by mouth every morning.     promethazine (PHENERGAN) 25 MG tablet Take 25 mg by mouth every morning.      Simethicone (GAS RELIEF 125 MAX ST PO)  Take 125 mg by mouth daily as needed (gas/bloating).     venlafaxine (EFFEXOR) 75 MG tablet Take 75 mg by mouth every morning.     acetaminophen (TYLENOL) 500 MG tablet Take 1,000 mg by mouth every 4 (four) hours as needed for headache (pain). (Patient not taking: Reported on 03/31/2021)     linaclotide (LINZESS) 72 MCG capsule Take 1 capsule (72 mcg total) by mouth daily before breakfast. (Patient not taking: Reported on 03/31/2021) 90 capsule 1   No current facility-administered medications for this visit.    Review of Systems: GENERAL: negative for malaise, night sweats HEENT: No changes in hearing or vision, no nose bleeds or other nasal problems. NECK: Negative for lumps, goiter, pain and significant neck swelling RESPIRATORY: Negative for cough, wheezing CARDIOVASCULAR: Negative for chest pain, leg swelling, palpitations, orthopnea GI: SEE HPI MUSCULOSKELETAL: Negative for joint pain or swelling, back pain, and muscle pain. SKIN: Negative for lesions, rash PSYCH: Negative for sleep disturbance, mood disorder and recent psychosocial stressors. HEMATOLOGY Negative for prolonged  bleeding, bruising easily, and swollen nodes. ENDOCRINE: Negative for cold or heat intolerance, polyuria, polydipsia and goiter. NEURO: negative for tremor, gait imbalance, syncope and seizures. The remainder of the review of systems is noncontributory.   Physical Exam: BP (!) 173/97 (BP Location: Left Arm, Patient Position: Sitting, Cuff Size: Small)   Pulse 84   Temp 98.1 F (36.7 C) (Oral)   Ht 5' (1.524 m)   Wt 138 lb 3.2 oz (62.7 kg)   BMI 26.99 kg/m  GENERAL: The patient is AO x3, in no acute distress. HEENT: Head is normocephalic and atraumatic. EOMI are intact. Mouth is well hydrated and without lesions. NECK: Supple. No masses LUNGS: Clear to auscultation. No presence of rhonchi/wheezing/rales. Adequate chest expansion HEART: RRR, normal s1 and s2. ABDOMEN: diffusely tender to palpation but no guarding, no peritoneal signs, and nondistended. BS +. No masses. EXTREMITIES: Without any cyanosis, clubbing, rash, lesions or edema. NEUROLOGIC: AOx3, no focal motor deficit. SKIN: no jaundice, no rashes  Imaging/Labs: as above  I personally reviewed and interpreted the available labs, imaging and endoscopic files.  Impression and Plan: Erika Ward is a 70 y.o. female with PMH anxiety, DM, depression, HTN, HLD, IBS, GERD s/p Nissen fundoplication, who presents for follow up of dysphagia and abdominal pain.  Patient has presented chronic symptoms characterized by heartburn, abdominal pain, changes in her bowel movements (predominantly diarrhea with occasional constipation), and abdominal bloating.  She has not presented any red flag signs and actually has gained weight.  Still believe that given the chronicity of her symptoms, this is related to a low bowel syndrome.  However, we will need to explore other etiologies with an EGD and colonoscopy (will also help explore her dysphagia) as planned in the past, we will also check for celiac serology today. She is having multiple bowel  movements frequently, will recommend stopping the Linzess for now and implementing a low FODMAP diet.  She can take Benefiber to increase stool bulk and avoid having fecal soiling accidents, can also benefit from taking IBgard as needed. She will continue taking Nexium 40 mg every day and we will assess if there is any endoscopic evidence of ongoing GERD.  Ultimately, if the endoscopic investigations are negative and she persists with the symptoms, we may need to consider performing pH impedance testing and esophageal manometry on her current dose of PPI.  -Schedule EGD with SB biopsies and possible ED and colonoscopy with random colon biopsies -  Stop Linzess - Continue Nexium 40 mg qday - Explained presumed etiology of IBS symptoms. Patient was counseled about the benefit of implementing a low FODMAP to improve symptoms and recurrent episodes. A dietary list was provided to the patient. Also, the patient was counseled about the benefit of avoiding stressing situations and potential environmental triggers leading to symptomatology. - Check celiac serologies - Start IBGard 1 tablet every 8-12 hours as needed for bloating - Minimize the use of daily Phenergan to avoid side effects - Start Benefiber fiber supplements daily to increase stool bulk - May consider abdominal CT scan to evaluate chronic abdominal pain depending on endoscopic findings.  All questions were answered.      Erika Quale, MD Gastroenterology and Hepatology [facility]

## 2021-03-31 NOTE — Progress Notes (Signed)
Erika Ward, M.D. Gastroenterology & Hepatology [facility] [address] [place], [place] [address]  Primary Care Physician: Caryl Bis, MD [place] [address]  I will communicate my assessment and recommendations to the referring MD via EMR.  Problems: IBS-M Chronic abdominal pain Chronic nausea  History of Present Illness: Erika Ward is a 70 y.o. female with PMH anxiety, DM, depression, HTN, HLD, IBS, GERD s/p Nissen fundoplication, who presents for follow up of dysphagia and abdominal pain.  The patient was last seen on 05/29/2020. At that time, the patient was scheduled for an EGD and a colonoscopy, but she canceled this procedure so she had back surgery.  She was also advised to stop taking Amitiza and was told to take Linzess 72 mcg every day.  Was also advised to continue taking Nexium 40 mg daily.  The patient reports she has presented gastrointestinal symptoms for the last 2 years, she believes.  She does not know exactly when her chronic gastrointestinal diseases started but she remembers that she was told that she had IBS since the late 1980s as she had some irregularity in her bowel movements and abdominal pain.  She reports that she has vertigo and she usually has significant nausea and dizziness, for which she takes initially Phenergan. If this does not work, she takes another medication for vertigo, but she does not feel it help as much as Phenergan. She takes Phenergan on a daily basis, has been getting these refills by her PCP.  She also reports that 2 years ago she has presented frequent diarrhea. She describes having frequent episodes of diarrhea described between soft to watery in consistency, which she describes as multiple Bms on a day. Some days she has constipation that can last up to 5 days. She reports that she takes Linzess every day, but if constipated she takes a stool softener and  Miralax. She reports having frequent episodes of fecal soiling due to urgency but no incontinence.  She reports that most of the days of the month she has diarrhea.  She has presented intermittent dysphagia for multiple years, which is not related to a specific type of food. It mostly happens with solids, but sometimes happens with liquids. Sometimes she feels the food is not coming down and has to vomit the food. She has heartburn almost three times a week, for which she takes Nexium, usually before breakfast.  She also reports having frequent bloating and discomfort, with generalized frequent pain.   The patient denies having any fever, chills, hematochezia, melena, hematemesis, jaundice, pruritus or weight loss (she reports she has been gaining weight).  Last EGD: 2015  Short esophagus with tortuous distal segment. Esophageal mucosa was coated with thin film of food debris. None critical ring at GE junction. 4 cm size sliding hiatal hernia with wide-open hiatus. Antral scar but no evidence of active peptic ulcer disease. Distal esophagus dilated with balloon to 19 mm disrupting ring at GE junction. Biopsy taken from mucosa of the esophagus. Last Colonoscopy: >10 years ago per the patient, no report available.  Past Medical History: Past Medical History:  Diagnosis Date   Anxiety    Arthritis    Chronic pain    Complication of anesthesia    slow to wake up   Depression    Diabetes ([place])    Type 2 x 1 yr   Dysphagia    Fatty liver    GERD (gastroesophageal reflux  disease)    High cholesterol    Hypertension    1 yr   Hypothyroid    IBS (irritable bowel syndrome)    Pneumonia    Sleep apnea     Past Surgical History: Past Surgical History:  Procedure Laterality Date   CHOLECYSTECTOMY     ESOPHAGOGASTRODUODENOSCOPY N/A 04/02/2014   Procedure: ESOPHAGOGASTRODUODENOSCOPY (EGD);  Surgeon: Rogene Houston, MD;  [facility];  Service: Endoscopy;  Laterality: N/A;   730   ESOPHAGOGASTRODUODENOSCOPY (EGD) WITH ESOPHAGEAL DILATION  05/18/2012   Procedure: ESOPHAGOGASTRODUODENOSCOPY (EGD) WITH ESOPHAGEAL DILATION;  Surgeon: Rogene Houston, MD;  [facility];  Service: Endoscopy;  Laterality: N/A;  320   LUMBAR LAMINECTOMY/DECOMPRESSION MICRODISCECTOMY Right 04/10/2020   Procedure: Right Lumbar four-five Laminectomy for synovial cyst;  Surgeon: Ashok Pall, MD;  Location: [place];  Service: Neurosurgery;  Laterality: Right;   MALONEY DILATION N/A 04/02/2014   Procedure: Venia Minks DILATION;  Surgeon: Rogene Houston, MD;  [facility];  Service: Endoscopy;  Laterality: N/A;   Nissan Wrap     PARTIAL HYSTERECTOMY     [facility]air [facility] bowel at [facility]      Family History:History reviewed. No pertinent family history.  Social History: Social History   Tobacco Use  Smoking Status Former   Packs/day: 0.50   Years: 20.00   Pack years: 10.00   Types: Cigarettes  Smokeless Tobacco Never  Tobacco Comments   quit x 10 yrs ago   Social History   Substance and Sexual Activity  Alcohol Use Yes   Comment: occasionally   Social History   Substance and Sexual Activity  Drug Use No    Allergies: Allergies  Allergen Reactions   Reglan [Metoclopramide] Anxiety   Crestor [Rosuvastatin] Other (See Comments)    Aching in legs   Norvasc [Amlodipine Besylate] Other (See Comments)    Swells legs   Prozac [Fluoxetine Hcl] Other (See Comments)    Made her nervous   Valsartan Other (See Comments)    Dehydrate,cramp   Zoloft [Sertraline Hcl] Other (See Comments)    Shaking    Zofran [Ondansetron Hcl] Rash    Medications: Current Outpatient Medications  Medication Sig Dispense Refill   acyclovir (ZOVIRAX) 400 MG tablet Take 400 mg by mouth every morning.     ALPRAZolam (XANAX) 1 MG tablet Take 2 mg by mouth at bedtime.     Aspirin-Salicylamide-Caffeine (BC HEADACHE POWDER PO) Take 1 packet by mouth 3 (three) times daily  as needed (severe headache).     Calcium Carbonate-Vitamin D (CALTRATE 600+D PO) Take 2 tablets by mouth every morning.     cyclobenzaprine (FLEXERIL) 10 MG tablet Take 1 tablet (10 mg total) by mouth 3 (three) times daily as needed for muscle spasms. 30 tablet 0   esomeprazole (NEXIUM) 40 MG capsule Take 40 mg by mouth every morning.      ezetimibe (ZETIA) 10 MG tablet Take 10 mg by mouth every morning.      fluticasone (FLONASE) 50 MCG/ACT nasal spray Place 1 spray into both nostrils 3 (three) times daily as needed (congestion).      levothyroxine (SYNTHROID, LEVOTHROID) 75 MCG tablet Take 75 mcg by mouth every morning.      losartan (COZAAR) 100 MG tablet Take 100 mg by mouth every morning.     promethazine (PHENERGAN) 25 MG tablet Take 25 mg by mouth every morning.      Simethicone (GAS RELIEF 125 MAX ST PO)  Take 125 mg by mouth daily as needed (gas/bloating).     venlafaxine (EFFEXOR) 75 MG tablet Take 75 mg by mouth every morning.     acetaminophen (TYLENOL) 500 MG tablet Take 1,000 mg by mouth every 4 (four) hours as needed for headache (pain). (Patient not taking: Reported on 03/31/2021)     linaclotide (LINZESS) 72 MCG capsule Take 1 capsule (72 mcg total) by mouth daily before breakfast. (Patient not taking: Reported on 03/31/2021) 90 capsule 1   No current facility-administered medications for this visit.    Review of Systems: GENERAL: negative for malaise, night sweats HEENT: No changes in hearing or vision, no nose bleeds or other nasal problems. NECK: Negative for lumps, goiter, pain and significant neck swelling RESPIRATORY: Negative for cough, wheezing CARDIOVASCULAR: Negative for chest pain, leg swelling, palpitations, orthopnea GI: SEE HPI MUSCULOSKELETAL: Negative for joint pain or swelling, back pain, and muscle pain. SKIN: Negative for lesions, rash PSYCH: Negative for sleep disturbance, mood disorder and recent psychosocial stressors. HEMATOLOGY Negative for prolonged  bleeding, bruising easily, and swollen nodes. ENDOCRINE: Negative for cold or heat intolerance, polyuria, polydipsia and goiter. NEURO: negative for tremor, gait imbalance, syncope and seizures. The remainder of the review of systems is noncontributory.   Physical Exam: BP (!) 173/97 (BP Location: Left Arm, Patient Position: Sitting, Cuff Size: Small)   Pulse 84   Temp 98.1 F (36.7 C) (Oral)   Ht 5' (1.524 m)   Wt 138 lb 3.2 oz (62.7 kg)   BMI 26.99 kg/m  GENERAL: The patient is AO x3, in no acute distress. HEENT: Head is normocephalic and atraumatic. EOMI are intact. Mouth is well hydrated and without lesions. NECK: Supple. No masses LUNGS: Clear to auscultation. No presence of rhonchi/wheezing/rales. Adequate chest expansion HEART: RRR, normal s1 and s2. ABDOMEN: diffusely tender to palpation but no guarding, no peritoneal signs, and nondistended. BS +. No masses. EXTREMITIES: Without any cyanosis, clubbing, rash, lesions or edema. NEUROLOGIC: AOx3, no focal motor deficit. SKIN: no jaundice, no rashes  Imaging/Labs: as above  I personally reviewed and interpreted the available labs, imaging and endoscopic files.  Impression and Plan: Erika Ward is a 70 y.o. female with PMH anxiety, DM, depression, HTN, HLD, IBS, GERD s/p Nissen fundoplication, who presents for follow up of dysphagia and abdominal pain.  Patient has presented chronic symptoms characterized by heartburn, abdominal pain, changes in her bowel movements (predominantly diarrhea with occasional constipation), and abdominal bloating.  She has not presented any red flag signs and actually has gained weight.  Still believe that given the chronicity of her symptoms, this is related to a low bowel syndrome.  However, we will need to explore other etiologies with an EGD and colonoscopy (will also help explore her dysphagia) as planned in the past, we will also check for celiac serology today. She is having multiple bowel  movements frequently, will recommend stopping the Linzess for now and implementing a low FODMAP diet.  She can take Benefiber to increase stool bulk and avoid having fecal soiling accidents, can also benefit from taking IBgard as needed. She will continue taking Nexium 40 mg every day and we will assess if there is any endoscopic evidence of ongoing GERD.  Ultimately, if the endoscopic investigations are negative and she persists with the symptoms, we may need to consider performing pH impedance testing and esophageal manometry on her current dose of PPI.  -Schedule EGD with SB biopsies and possible ED and colonoscopy with random colon biopsies -  Stop Linzess - Continue Nexium 40 mg qday - Explained presumed etiology of IBS symptoms. Patient was counseled about the benefit of implementing a low FODMAP to improve symptoms and recurrent episodes. A dietary list was provided to the patient. Also, the patient was counseled about the benefit of avoiding stressing situations and potential environmental triggers leading to symptomatology. - Check celiac serologies - Start IBGard 1 tablet every 8-12 hours as needed for bloating - Minimize the use of daily Phenergan to avoid side effects - Start Benefiber fiber supplements daily to increase stool bulk - May consider abdominal CT scan to evaluate chronic abdominal pain depending on endoscopic findings.  All questions were answered.      Harvel Quale, MD Gastroenterology and Hepatology [facility]

## 2021-03-31 NOTE — Patient Instructions (Addendum)
Schedule EGD with SB biopsies and possible ED and colonoscopy with random colon biopsies Stop Linzess Continue Nexium 40 mg qday Explained presumed etiology of IBS symptoms. Patient was counseled about the benefit of implementing a low FODMAP to improve symptoms and recurrent episodes. A dietary list was provided to the patient. Also, the patient was counseled about the benefit of avoiding stressing situations and potential environmental triggers leading to symptomatology. Perform blood workup Start IBGard 1 tablet every 8-12 hours as needed for bloating Minimize the use of daily Phenergan to avoid side effects Start Benefiber fiber supplements daily to increase stool bulk

## 2021-04-02 DIAGNOSIS — R140 Abdominal distension (gaseous): Secondary | ICD-10-CM | POA: Diagnosis not present

## 2021-04-02 DIAGNOSIS — K582 Mixed irritable bowel syndrome: Secondary | ICD-10-CM | POA: Diagnosis not present

## 2021-04-03 LAB — CELIAC DISEASE PANEL
(tTG) Ab, IgA: <1 U/mL
(tTG) Ab, IgG: <1 U/mL
Gliadin IgA: <1 U/mL
Gliadin IgG: <1 U/mL
Immunoglobulin A: 256 mg/dL (ref 70–320)

## 2021-04-07 ENCOUNTER — Encounter (INDEPENDENT_AMBULATORY_CARE_PROVIDER_SITE_OTHER): Payer: Self-pay

## 2021-04-18 ENCOUNTER — Encounter (HOSPITAL_COMMUNITY)
Admission: RE | Disposition: A | Discharge status: Home / Self Care | Payer: Self-pay | Source: Home / Self Care | Attending: Gastroenterology

## 2021-04-18 ENCOUNTER — Encounter (HOSPITAL_COMMUNITY): Payer: Self-pay | Admitting: Gastroenterology

## 2021-04-18 ENCOUNTER — Ambulatory Visit (HOSPITAL_COMMUNITY)
Admission: RE | Admit: 2021-04-18 | Discharge: 2021-04-18 | Disposition: A | Discharge status: Home / Self Care | Payer: Medicare Other | Attending: Gastroenterology | Admitting: Gastroenterology

## 2021-04-18 ENCOUNTER — Ambulatory Visit (HOSPITAL_COMMUNITY): Payer: Medicare Other | Admitting: Anesthesiology

## 2021-04-18 ENCOUNTER — Other Ambulatory Visit: Payer: Self-pay

## 2021-04-18 DIAGNOSIS — K649 Unspecified hemorrhoids: Secondary | ICD-10-CM | POA: Diagnosis not present

## 2021-04-18 DIAGNOSIS — K580 Irritable bowel syndrome with diarrhea: Secondary | ICD-10-CM | POA: Insufficient documentation

## 2021-04-18 DIAGNOSIS — E7800 Pure hypercholesterolemia, unspecified: Secondary | ICD-10-CM | POA: Diagnosis not present

## 2021-04-18 DIAGNOSIS — D120 Benign neoplasm of cecum: Secondary | ICD-10-CM

## 2021-04-18 DIAGNOSIS — I10 Essential (primary) hypertension: Secondary | ICD-10-CM | POA: Diagnosis not present

## 2021-04-18 DIAGNOSIS — R1084 Generalized abdominal pain: Secondary | ICD-10-CM | POA: Diagnosis not present

## 2021-04-18 DIAGNOSIS — K259 Gastric ulcer, unspecified as acute or chronic, without hemorrhage or perforation: Secondary | ICD-10-CM

## 2021-04-18 DIAGNOSIS — Z79899 Other long term (current) drug therapy: Secondary | ICD-10-CM | POA: Diagnosis not present

## 2021-04-18 DIAGNOSIS — K760 Fatty (change of) liver, not elsewhere classified: Secondary | ICD-10-CM | POA: Diagnosis not present

## 2021-04-18 DIAGNOSIS — D126 Benign neoplasm of colon, unspecified: Secondary | ICD-10-CM | POA: Diagnosis not present

## 2021-04-18 DIAGNOSIS — R1310 Dysphagia, unspecified: Secondary | ICD-10-CM | POA: Insufficient documentation

## 2021-04-18 DIAGNOSIS — Z888 Allergy status to other drugs, medicaments and biological substances status: Secondary | ICD-10-CM | POA: Diagnosis not present

## 2021-04-18 DIAGNOSIS — K219 Gastro-esophageal reflux disease without esophagitis: Secondary | ICD-10-CM | POA: Diagnosis not present

## 2021-04-18 DIAGNOSIS — E785 Hyperlipidemia, unspecified: Secondary | ICD-10-CM | POA: Insufficient documentation

## 2021-04-18 DIAGNOSIS — E119 Type 2 diabetes mellitus without complications: Secondary | ICD-10-CM | POA: Insufficient documentation

## 2021-04-18 DIAGNOSIS — K449 Diaphragmatic hernia without obstruction or gangrene: Secondary | ICD-10-CM | POA: Diagnosis not present

## 2021-04-18 DIAGNOSIS — R140 Abdominal distension (gaseous): Secondary | ICD-10-CM | POA: Diagnosis not present

## 2021-04-18 DIAGNOSIS — R197 Diarrhea, unspecified: Secondary | ICD-10-CM | POA: Diagnosis not present

## 2021-04-18 DIAGNOSIS — Z87891 Personal history of nicotine dependence: Secondary | ICD-10-CM | POA: Insufficient documentation

## 2021-04-18 DIAGNOSIS — Z79890 Hormone replacement therapy (postmenopausal): Secondary | ICD-10-CM | POA: Insufficient documentation

## 2021-04-18 DIAGNOSIS — E039 Hypothyroidism, unspecified: Secondary | ICD-10-CM | POA: Insufficient documentation

## 2021-04-18 DIAGNOSIS — K635 Polyp of colon: Secondary | ICD-10-CM | POA: Diagnosis not present

## 2021-04-18 HISTORY — PX: BIOPSY: SHX5522

## 2021-04-18 HISTORY — PX: ESOPHAGOGASTRODUODENOSCOPY (EGD) WITH PROPOFOL: SHX5813

## 2021-04-18 HISTORY — PX: POLYPECTOMY: SHX5525

## 2021-04-18 HISTORY — PX: COLONOSCOPY WITH PROPOFOL: SHX5780

## 2021-04-18 LAB — HM COLONOSCOPY

## 2021-04-18 SURGERY — COLONOSCOPY WITH PROPOFOL
Anesthesia: General

## 2021-04-18 MED ORDER — PROPOFOL 10 MG/ML IV BOLUS
INTRAVENOUS | Status: DC | PRN
  Administered 2021-04-18: 20 mg via INTRAVENOUS
  Administered 2021-04-18: 40 mg via INTRAVENOUS

## 2021-04-18 MED ORDER — PROPOFOL 1000 MG/100ML IV EMUL
INTRAVENOUS | Status: AC
  Filled 2021-04-18: qty 100

## 2021-04-18 MED ORDER — MIDAZOLAM HCL 2 MG/2ML IJ SOLN
INTRAMUSCULAR | Status: AC
  Filled 2021-04-18: qty 2

## 2021-04-18 MED ORDER — MIDAZOLAM HCL 5 MG/5ML IJ SOLN
INTRAMUSCULAR | Status: DC | PRN
  Administered 2021-04-18: 2 mg via INTRAVENOUS

## 2021-04-18 MED ORDER — PROPOFOL 500 MG/50ML IV EMUL
INTRAVENOUS | Status: DC | PRN
  Administered 2021-04-18: 150 ug/kg/min via INTRAVENOUS

## 2021-04-18 MED ORDER — LACTATED RINGERS IV SOLN: INTRAVENOUS | Status: DC

## 2021-04-18 NOTE — Op Note (Signed)
[facility] Patient Name: Erika Ward Procedure Date: 04/18/2021 9:37 AM MRN: [id number] Date of Birth: 15-Jun-1951 Attending MD: [name] ,  CSN: [id number] Age: 70 Admit Type: Outpatient Procedure:                Colonoscopy Indications:              Clinically significant diarrhea of unexplained                            origin Providers:                [name], Caprice Kluver, [place] Risa Grill, Technician Referring MD:              Medicines:                Monitored Anesthesia Care Complications:            No immediate complications. Estimated Blood Loss:     Estimated blood loss: none. Procedure:                Pre-Anesthesia Assessment:                           - Prior to the procedure, a History and Physical                            was performed, and patient medications, allergies                            and sensitivities were reviewed. The patient's                            tolerance of previous anesthesia was reviewed.                           - The risks and benefits of the procedure and the                            sedation options and risks were discussed with the                            patient. All questions were answered and informed                            consent was obtained.                           - ASA Grade Assessment: III - A patient with severe                            systemic disease.                           After obtaining informed consent, the colonoscope  was passed under direct vision. Throughout the                            procedure, the patient's blood pressure, pulse, and                            oxygen saturations were monitored continuously. The                            [id number]-HQ190L ([id number]) scope was introduced through                            the anus and advanced to the the cecum, identified                            by appendiceal  orifice and ileocecal valve. The                            colonoscopy was performed without difficulty. The                            patient tolerated the procedure well. The quality                            of the bowel preparation was adequate. Scope In: 9:41:42 AM Scope Out: 10:06:37 AM Scope Withdrawal Time: 0 hours 16 minutes 10 seconds  Total Procedure Duration: 0 hours 24 minutes 55 seconds  Findings:      Hemorrhoids were found on perianal exam.      A 6 mm polyp was found in the cecum. The polyp was sessile. The polyp       was removed with a cold snare. Resection and retrieval were complete.      The colon (entire examined portion) appeared normal. Biopsies for       histology were taken with a cold forceps from the right colon and left       colon for evaluation of microscopic colitis.      The retroflexed view of the distal rectum and anal verge was normal and       showed no anal or rectal abnormalities. Impression:               - Hemorrhoids found on perianal exam.                           - One 6 mm polyp in the cecum, removed with a cold                            snare. Resected and retrieved.                           - The entire examined colon is normal. Biopsied.                           - The distal rectum and anal verge are normal on  retroflexion view. Moderate Sedation:      Per Anesthesia Care Recommendation:           - Discharge patient to home (ambulatory).                           - Resume previous diet.                           - Await pathology results.                           - Repeat colonoscopy for surveillance based on                            pathology results. Procedure Code(s):        --- Professional ---                           [phone], Colonoscopy, flexible; with removal of                            tumor(s), polyp(s), or other lesion(s) by snare                            technique                            45380, 99, Colonoscopy, flexible; with biopsy,                            single or multiple Diagnosis Code(s):        --- Professional ---                           K63.5, Polyp of colon                           K64.9, Unspecified hemorrhoids                           R19.7, Diarrhea, unspecified CPT copyright 2019 American Medical Association. All rights reserved. The codes documented in this report are preliminary and upon coder review may  be revised to meet current compliance requirements. [name], MD [name],  04/18/2021 10:11:03 AM This report has been signed electronically. Number of Addenda: 0

## 2021-04-18 NOTE — Discharge Instructions (Addendum)
You are being discharged to home.  Resume your previous diet.  We are waiting for your pathology results.  Do not take any ibuprofen (including Advil, Motrin or Nuprin), naproxen, or other non-steroidal anti-inflammatory drugs.  Schedule CT abdomen and pelvis with IV contrast. *******MESSAGE LEFT AT OFFICE OF NEED TO SCHEDULE FOR PATIENT, OFFICE TO CALL****** Continue Nexium 40 mg every day.  We are waiting for your pathology results.  Your physician has recommended a repeat colonoscopy for surveillance based on pathology results.

## 2021-04-18 NOTE — Op Note (Signed)
[facility] Patient Name: Erika Ward Procedure Date: 04/18/2021 9:07 AM MRN: [id number] Date of Birth: 03-Mar-1951 Attending MD: [name] ,  CSN: [id number] Age: 70 Admit Type: Outpatient Procedure:                Upper GI endoscopy Indications:              Generalized abdominal pain, Dysphagia, Abdominal                            bloating Providers:                [name], Caprice Kluver, [place] Risa Grill, Technician Referring MD:              Medicines:                Monitored Anesthesia Care Complications:            No immediate complications. Estimated Blood Loss:     Estimated blood loss: none. Procedure:                Pre-Anesthesia Assessment:                           - Prior to the procedure, a History and Physical                            was performed, and patient medications, allergies                            and sensitivities were reviewed. The patient's                            tolerance of previous anesthesia was reviewed.                           - The risks and benefits of the procedure and the                            sedation options and risks were discussed with the                            patient. All questions were answered and informed                            consent was obtained.                           - ASA Grade Assessment: III - A patient with severe                            systemic disease.                           After obtaining informed consent, the endoscope was  passed under direct vision. Throughout the                            procedure, the patient's blood pressure, pulse, and                            oxygen saturations were monitored continuously. The                            [id number]-H190 ([id number]) scope was introduced through the                            mouth, and advanced to the second part of duodenum.                            The  upper GI endoscopy was accomplished without                            difficulty. The patient tolerated the procedure                            well. Scope In: 9:20:39 AM Scope Out: 9:34:45 AM Total Procedure Duration: 0 hours 14 minutes 6 seconds  Findings:      No endoscopic abnormality was evident in the esophagus to explain the       patient's complaint of dysphagia. It was decided, however, to proceed       with dilation of the entire esophagus. A guidewire was placed and the       scope was withdrawn. Dilation was performed with a Savary dilator with       no resistance at 18 mm.      A 4 cm hiatal hernia with multiple Cameron ulcers was found. The hiatal       narrowing was 32 cm from the incisors. The Z-line was 28 cm from the       incisors.      The entire examined stomach was normal.      The examined duodenum was normal. Biopsies were taken with a cold       forceps for histology. Impression:               - No endoscopic esophageal abnormality to explain                            patient's dysphagia. Esophagus dilated. Dilated.                           - 4 cm hiatal hernia with multiple Cameron ulcers.                           - Normal stomach.                           - Normal examined duodenum. Biopsied. Moderate Sedation:      Per Anesthesia Care Recommendation:           - Discharge patient to home (ambulatory).                           -  Resume previous diet.                           - Await pathology results.                           - Continue Nexium 40 mg every day.                           - No ibuprofen, naproxen, or other non-steroidal                            anti-inflammatory drugs.                           - Schedule CT abdomen and pelvis with IV contrast. Procedure Code(s):        --- Professional ---                           [phone], Esophagogastroduodenoscopy, flexible,                            transoral; with insertion of guide wire  followed by                            passage of dilator(s) through esophagus over guide                            wire                           43239, 68, Esophagogastroduodenoscopy, flexible,                            transoral; with biopsy, single or multiple Diagnosis Code(s):        --- Professional ---                           R13.10, Dysphagia, unspecified                           K44.9, Diaphragmatic hernia without obstruction or                            gangrene                           K25.9, Gastric ulcer, unspecified as acute or                            chronic, without hemorrhage or perforation                           R10.84, Generalized abdominal pain                           R14.0, Abdominal distension (gaseous) CPT copyright 2019 American Medical Association. All rights reserved.  The codes documented in this report are preliminary and upon coder review may  be revised to meet current compliance requirements. [name], MD [name],  04/18/2021 9:40:51 AM This report has been signed electronically. Number of Addenda: 0

## 2021-04-18 NOTE — Transfer of Care (Signed)
Immediate Anesthesia Transfer of Care Note  Patient: Erika Ward  Procedure(s) Performed: COLONOSCOPY WITH PROPOFOL ESOPHAGOGASTRODUODENOSCOPY (EGD) WITH PROPOFOL BIOPSY POLYPECTOMY  Patient Location: Short Stay  Anesthesia Type:General  Level of Consciousness: awake  Airway & Oxygen Therapy: Patient Spontanous Breathing  Post-op Assessment: Report given to RN  Post vital signs: Reviewed  Last Vitals:  Vitals Value Taken Time  BP    Temp    Pulse    Resp    SpO2      Last Pain:  Vitals:   04/18/21 0915  TempSrc:   PainSc: 8       Patients Stated Pain Goal: 8 (92/44/62 8638)  Complications: No notable events documented.

## 2021-04-18 NOTE — Anesthesia Postprocedure Evaluation (Signed)
Anesthesia Post Note  Patient: Erika Ward  Procedure(s) Performed: COLONOSCOPY WITH PROPOFOL ESOPHAGOGASTRODUODENOSCOPY (EGD) WITH PROPOFOL BIOPSY POLYPECTOMY  Patient location during evaluation: Endoscopy Anesthesia Type: General Level of consciousness: awake and alert Pain management: pain level controlled Vital Signs Assessment: post-procedure vital signs reviewed and stable Respiratory status: spontaneous breathing Cardiovascular status: blood pressure returned to baseline and stable Postop Assessment: no apparent nausea or vomiting Anesthetic complications: no   No notable events documented.   Last Vitals:  Vitals:   04/18/21 0745  BP: (!) 165/90  Pulse: 80  Temp: 36.6 C  SpO2: 99%    Last Pain:  Vitals:   04/18/21 0915  TempSrc:   PainSc: 8                  Dorinne Graeff,Zakira

## 2021-04-18 NOTE — Anesthesia Preprocedure Evaluation (Signed)
Anesthesia Evaluation  Patient identified by MRN, date of birth, ID band Patient awake    Reviewed: Allergy & Precautions, H&P , NPO status , Patient's Chart, lab work & pertinent test results, reviewed documented beta blocker date and time   History of Anesthesia Complications (+) PROLONGED EMERGENCE and history of anesthetic complications  Airway Mallampati: II  TM Distance: >3 FB Neck ROM: full    Dental no notable dental hx.    Pulmonary sleep apnea , former smoker,    Pulmonary exam normal breath sounds clear to auscultation       Cardiovascular Exercise Tolerance: Good hypertension, negative cardio ROS   Rhythm:regular Rate:Normal     Neuro/Psych PSYCHIATRIC DISORDERS Anxiety Depression negative neurological ROS     GI/Hepatic Neg liver ROS, GERD  Medicated,  Endo/Other  diabetes, Type 2Hypothyroidism   Renal/GU negative Renal ROS  negative genitourinary   Musculoskeletal   Abdominal   Peds  Hematology negative hematology ROS (+)   Anesthesia Other Findings   Reproductive/Obstetrics negative OB ROS                             Anesthesia Physical Anesthesia Plan  ASA: 2  Anesthesia Plan: General   Post-op Pain Management:    Induction:   PONV Risk Score and Plan: Propofol infusion  Airway Management Planned:   Additional Equipment:   Intra-op Plan:   Post-operative Plan:   Informed Consent: I have reviewed the patients History and Physical, chart, labs and discussed the procedure including the risks, benefits and alternatives for the proposed anesthesia with the patient or authorized representative who has indicated his/her understanding and acceptance.     Dental Advisory Given  Plan Discussed with: CRNA  Anesthesia Plan Comments:         Anesthesia Quick Evaluation

## 2021-04-18 NOTE — Interval H&P Note (Signed)
History and Physical Interval Note:  04/18/2021 8:28 AM Erika Ward is a 70 y.o. female with PMH anxiety, DM, depression, HTN, HLD, IBS, GERD s/p Nissen fundoplication, who presents for follow up of dysphagia and abdominal pain, diarrhea and constipation.  BP (!) 165/90   Pulse 80   Temp 97.8 F (36.6 C) (Oral)   Ht 5' (1.524 m)   Wt 62.7 kg   SpO2 99%   BMI 26.99 kg/m  GENERAL: The patient is AO x3, in no acute distress. HEENT: Head is normocephalic and atraumatic. EOMI are intact. Mouth is well hydrated and without lesions. NECK: Supple. No masses LUNGS: Clear to auscultation. No presence of rhonchi/wheezing/rales. Adequate chest expansion HEART: RRR, normal s1 and s2. ABDOMEN: mildly tender to palpation diffusely, no guarding, no peritoneal signs, and nondistended. BS +. No masses. EXTREMITIES: Without any cyanosis, clubbing, rash, lesions or edema. NEUROLOGIC: AOx3, no focal motor deficit. SKIN: no jaundice, no rashes   Erika Ward  has presented today for surgery, with the diagnosis of Chronic Diarrhea Dysphagia.  The various methods of treatment have been discussed with the patient and family. After consideration of risks, benefits and other options for treatment, the patient has consented to  Procedure(s) with comments: COLONOSCOPY WITH PROPOFOL (N/A) - 12:30 ESOPHAGOGASTRODUODENOSCOPY (EGD) WITH PROPOFOL (N/A) as a surgical intervention.  The patient's history has been reviewed, patient examined, no change in status, stable for surgery.  I have reviewed the patient's chart and labs.  Questions were answered to the patient's satisfaction.     [name] Mayorga

## 2021-04-21 LAB — SURGICAL PATHOLOGY

## 2021-04-22 ENCOUNTER — Other Ambulatory Visit (INDEPENDENT_AMBULATORY_CARE_PROVIDER_SITE_OTHER): Payer: Self-pay

## 2021-04-22 ENCOUNTER — Encounter (INDEPENDENT_AMBULATORY_CARE_PROVIDER_SITE_OTHER): Payer: Self-pay | Admitting: *Deleted

## 2021-04-22 DIAGNOSIS — G8929 Other chronic pain: Secondary | ICD-10-CM

## 2021-04-23 ENCOUNTER — Encounter (HOSPITAL_COMMUNITY): Payer: Self-pay | Admitting: Gastroenterology

## 2021-05-20 ENCOUNTER — Other Ambulatory Visit (INDEPENDENT_AMBULATORY_CARE_PROVIDER_SITE_OTHER): Payer: Self-pay

## 2021-05-20 DIAGNOSIS — Z01812 Encounter for preprocedural laboratory examination: Secondary | ICD-10-CM

## 2021-05-20 DIAGNOSIS — I10 Essential (primary) hypertension: Secondary | ICD-10-CM

## 2021-05-21 DIAGNOSIS — N132 Hydronephrosis with renal and ureteral calculous obstruction: Secondary | ICD-10-CM | POA: Diagnosis not present

## 2021-05-21 DIAGNOSIS — K449 Diaphragmatic hernia without obstruction or gangrene: Secondary | ICD-10-CM | POA: Diagnosis not present

## 2021-05-21 DIAGNOSIS — Z01812 Encounter for preprocedural laboratory examination: Secondary | ICD-10-CM | POA: Diagnosis not present

## 2021-05-21 DIAGNOSIS — Z789 Other specified health status: Secondary | ICD-10-CM | POA: Diagnosis not present

## 2021-05-21 DIAGNOSIS — N131 Hydronephrosis with ureteral stricture, not elsewhere classified: Secondary | ICD-10-CM | POA: Diagnosis not present

## 2021-05-21 DIAGNOSIS — I700 Atherosclerosis of aorta: Secondary | ICD-10-CM | POA: Diagnosis not present

## 2021-05-21 DIAGNOSIS — R1013 Epigastric pain: Secondary | ICD-10-CM | POA: Diagnosis not present

## 2021-05-21 DIAGNOSIS — G8929 Other chronic pain: Secondary | ICD-10-CM | POA: Diagnosis not present

## 2021-05-21 DIAGNOSIS — I10 Essential (primary) hypertension: Secondary | ICD-10-CM | POA: Diagnosis not present

## 2021-05-21 DIAGNOSIS — K5730 Diverticulosis of large intestine without perforation or abscess without bleeding: Secondary | ICD-10-CM | POA: Diagnosis not present

## 2021-05-21 DIAGNOSIS — R109 Unspecified abdominal pain: Secondary | ICD-10-CM | POA: Diagnosis not present

## 2021-05-26 ENCOUNTER — Telehealth (INDEPENDENT_AMBULATORY_CARE_PROVIDER_SITE_OTHER): Payer: Self-pay | Admitting: *Deleted

## 2021-05-26 NOTE — Telephone Encounter (Signed)
I placed report on your desk for review.

## 2021-05-26 NOTE — Telephone Encounter (Signed)
Spoke with the patient today, explained that we do not have the report of her scan which was performed at [facility].  We will get back to her once we have the report available.  She states that she had the imaging performed on 05/21/2021 but no report is available in care everywhere.  I advised her to use lidocaine patches for now as needed as she has not had any improvement with the use of Bentyl.

## 2021-05-26 NOTE — Telephone Encounter (Signed)
Pt left vm that she wanted results of CT scan done last Wednesday. I looked in chart and it looks like it is scheduled for 12/19 but pt states she had it done 12/7 and I looked under care everywhere and saw it was done at [facility]. She is wanting results. States she is still having hurting on right side.    [phone]

## 2021-05-26 NOTE — Telephone Encounter (Signed)
Received the report of the most recent CT of the abdomen and pelvis with IV contrast performed at [facility] (no images were available for me to review).  There was presence of a 4 mm left ureteral stone with mild hydronephrosis but no other abnormalities.  There was presence of diverticulosis and aortic atherosclerosis with a moderate sized hiatal hernia.  I explained to the patient that these alterations do not explain her current symptoms.  She will try the lidocaine patches and Bentyl, and we will readdress her pain control with consideration for possible low-dose TCA in the future.

## 2021-05-30 DIAGNOSIS — E782 Mixed hyperlipidemia: Secondary | ICD-10-CM | POA: Diagnosis not present

## 2021-05-30 DIAGNOSIS — K219 Gastro-esophageal reflux disease without esophagitis: Secondary | ICD-10-CM | POA: Diagnosis not present

## 2021-05-30 DIAGNOSIS — E1165 Type 2 diabetes mellitus with hyperglycemia: Secondary | ICD-10-CM | POA: Diagnosis not present

## 2021-05-30 DIAGNOSIS — E039 Hypothyroidism, unspecified: Secondary | ICD-10-CM | POA: Diagnosis not present

## 2021-05-30 DIAGNOSIS — E7849 Other hyperlipidemia: Secondary | ICD-10-CM | POA: Diagnosis not present

## 2021-05-30 DIAGNOSIS — I10 Essential (primary) hypertension: Secondary | ICD-10-CM | POA: Diagnosis not present

## 2021-06-02 ENCOUNTER — Ambulatory Visit (HOSPITAL_COMMUNITY): Payer: Medicare Other

## 2021-06-02 ENCOUNTER — Telehealth (INDEPENDENT_AMBULATORY_CARE_PROVIDER_SITE_OTHER): Payer: Self-pay

## 2021-06-02 ENCOUNTER — Other Ambulatory Visit (INDEPENDENT_AMBULATORY_CARE_PROVIDER_SITE_OTHER): Payer: Self-pay | Admitting: Gastroenterology

## 2021-06-02 MED ORDER — SUCRALFATE 1 GM/10ML PO SUSP: 1.0000 g | Freq: Three times a day (TID) | ORAL | 0 refills | Status: AC

## 2021-06-02 NOTE — Telephone Encounter (Signed)
Medication sent to pharmacy  

## 2021-06-02 NOTE — Telephone Encounter (Signed)
Erika Ward is calling asking if Dr Jenetta Downer would call her in some carafate for her abdominal pain, please advise?

## 2021-06-10 DIAGNOSIS — E039 Hypothyroidism, unspecified: Secondary | ICD-10-CM | POA: Diagnosis not present

## 2021-06-10 DIAGNOSIS — I10 Essential (primary) hypertension: Secondary | ICD-10-CM | POA: Diagnosis not present

## 2021-06-10 DIAGNOSIS — M545 Low back pain, unspecified: Secondary | ICD-10-CM | POA: Diagnosis not present

## 2021-06-10 DIAGNOSIS — E1165 Type 2 diabetes mellitus with hyperglycemia: Secondary | ICD-10-CM | POA: Diagnosis not present

## 2021-06-10 DIAGNOSIS — M797 Fibromyalgia: Secondary | ICD-10-CM | POA: Diagnosis not present

## 2021-06-10 DIAGNOSIS — R4582 Worries: Secondary | ICD-10-CM | POA: Diagnosis not present

## 2021-06-10 DIAGNOSIS — K580 Irritable bowel syndrome with diarrhea: Secondary | ICD-10-CM | POA: Diagnosis not present

## 2021-06-24 DIAGNOSIS — M810 Age-related osteoporosis without current pathological fracture: Secondary | ICD-10-CM | POA: Diagnosis not present

## 2021-06-24 DIAGNOSIS — M8589 Other specified disorders of bone density and structure, multiple sites: Secondary | ICD-10-CM | POA: Diagnosis not present

## 2021-07-31 DIAGNOSIS — G43909 Migraine, unspecified, not intractable, without status migrainosus: Secondary | ICD-10-CM | POA: Diagnosis not present

## 2021-07-31 DIAGNOSIS — R42 Dizziness and giddiness: Secondary | ICD-10-CM | POA: Diagnosis not present

## 2021-08-04 ENCOUNTER — Ambulatory Visit (INDEPENDENT_AMBULATORY_CARE_PROVIDER_SITE_OTHER): Payer: Medicare Other | Admitting: Gastroenterology

## 2021-08-04 ENCOUNTER — Encounter (INDEPENDENT_AMBULATORY_CARE_PROVIDER_SITE_OTHER): Payer: Self-pay | Admitting: Gastroenterology

## 2021-08-26 IMAGING — CR DG LUMBAR SPINE 2-3V
2 series · 2 of 2 positions shown · non-contrast
Comparison: MR 03/19/2020 and previous

CLINICAL DATA: Right L4-5 laminectomy

EXAM:
LUMBAR SPINE - 2-3 VIEW

[xtable lateral (1 of 2)]
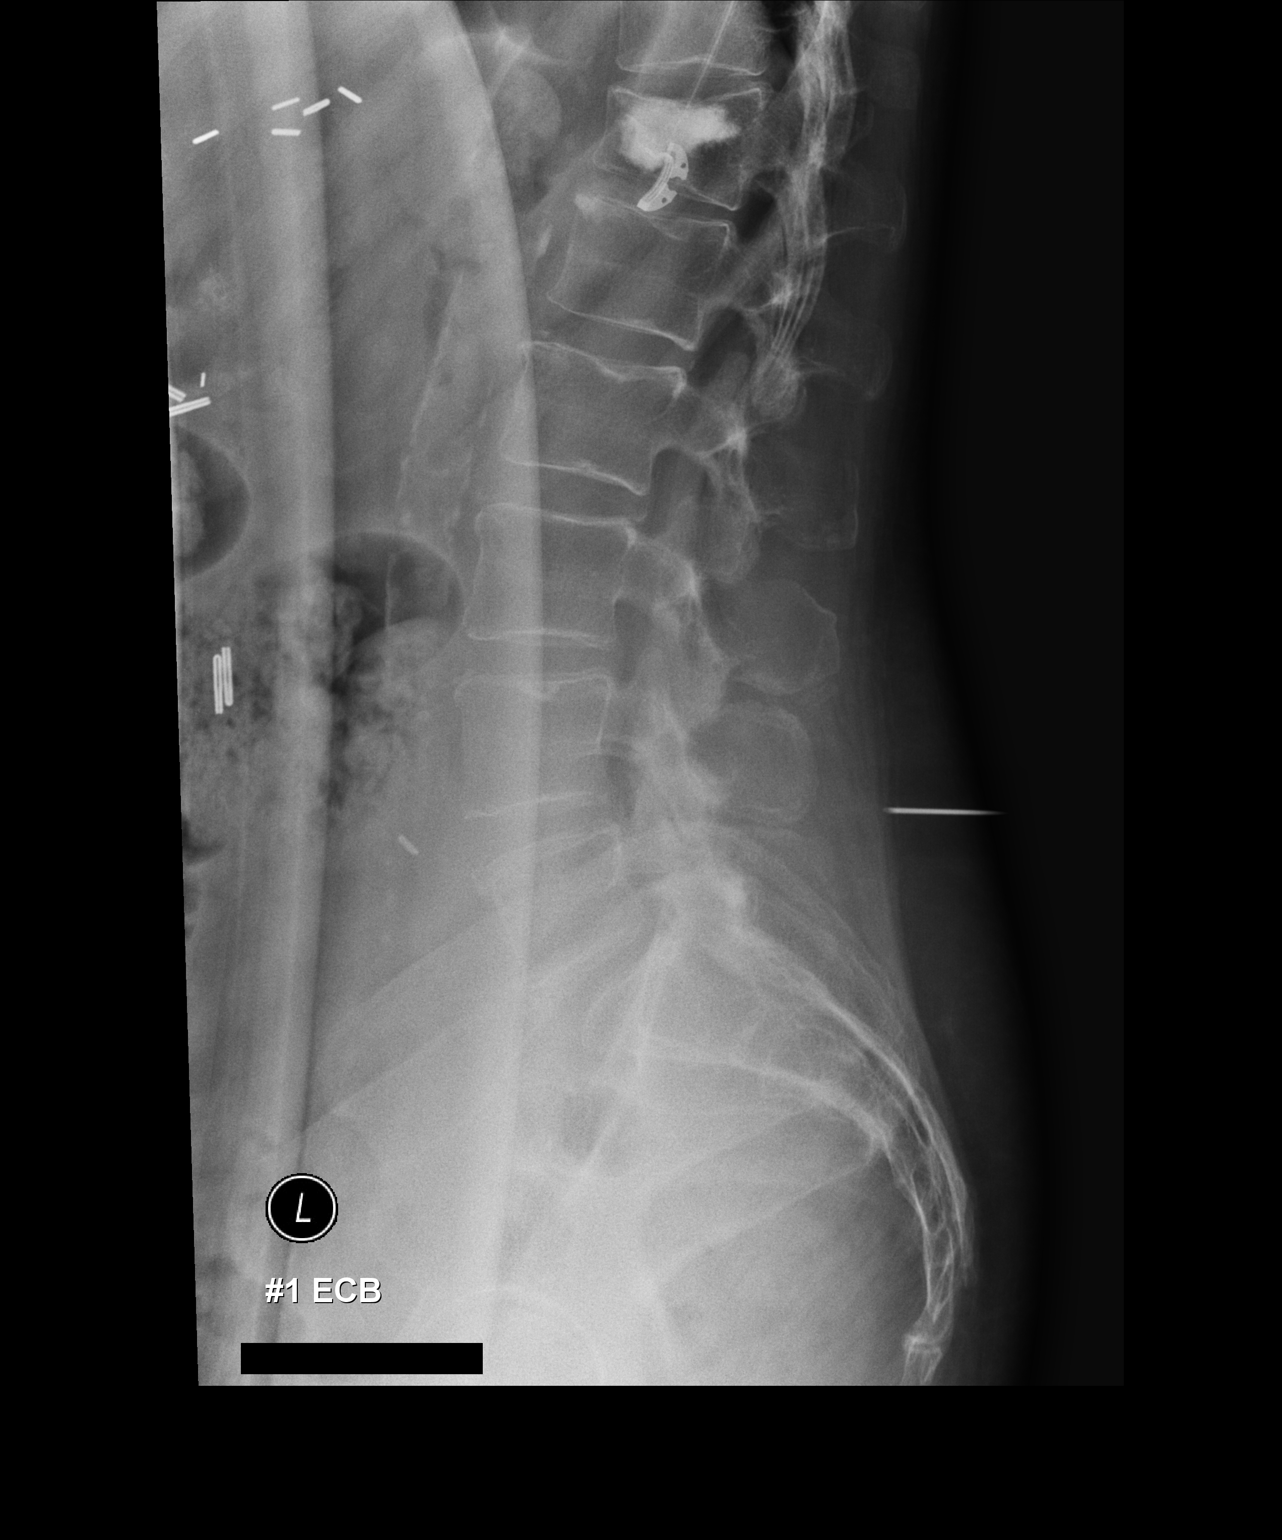

[xtable lateral (2 of 2)]
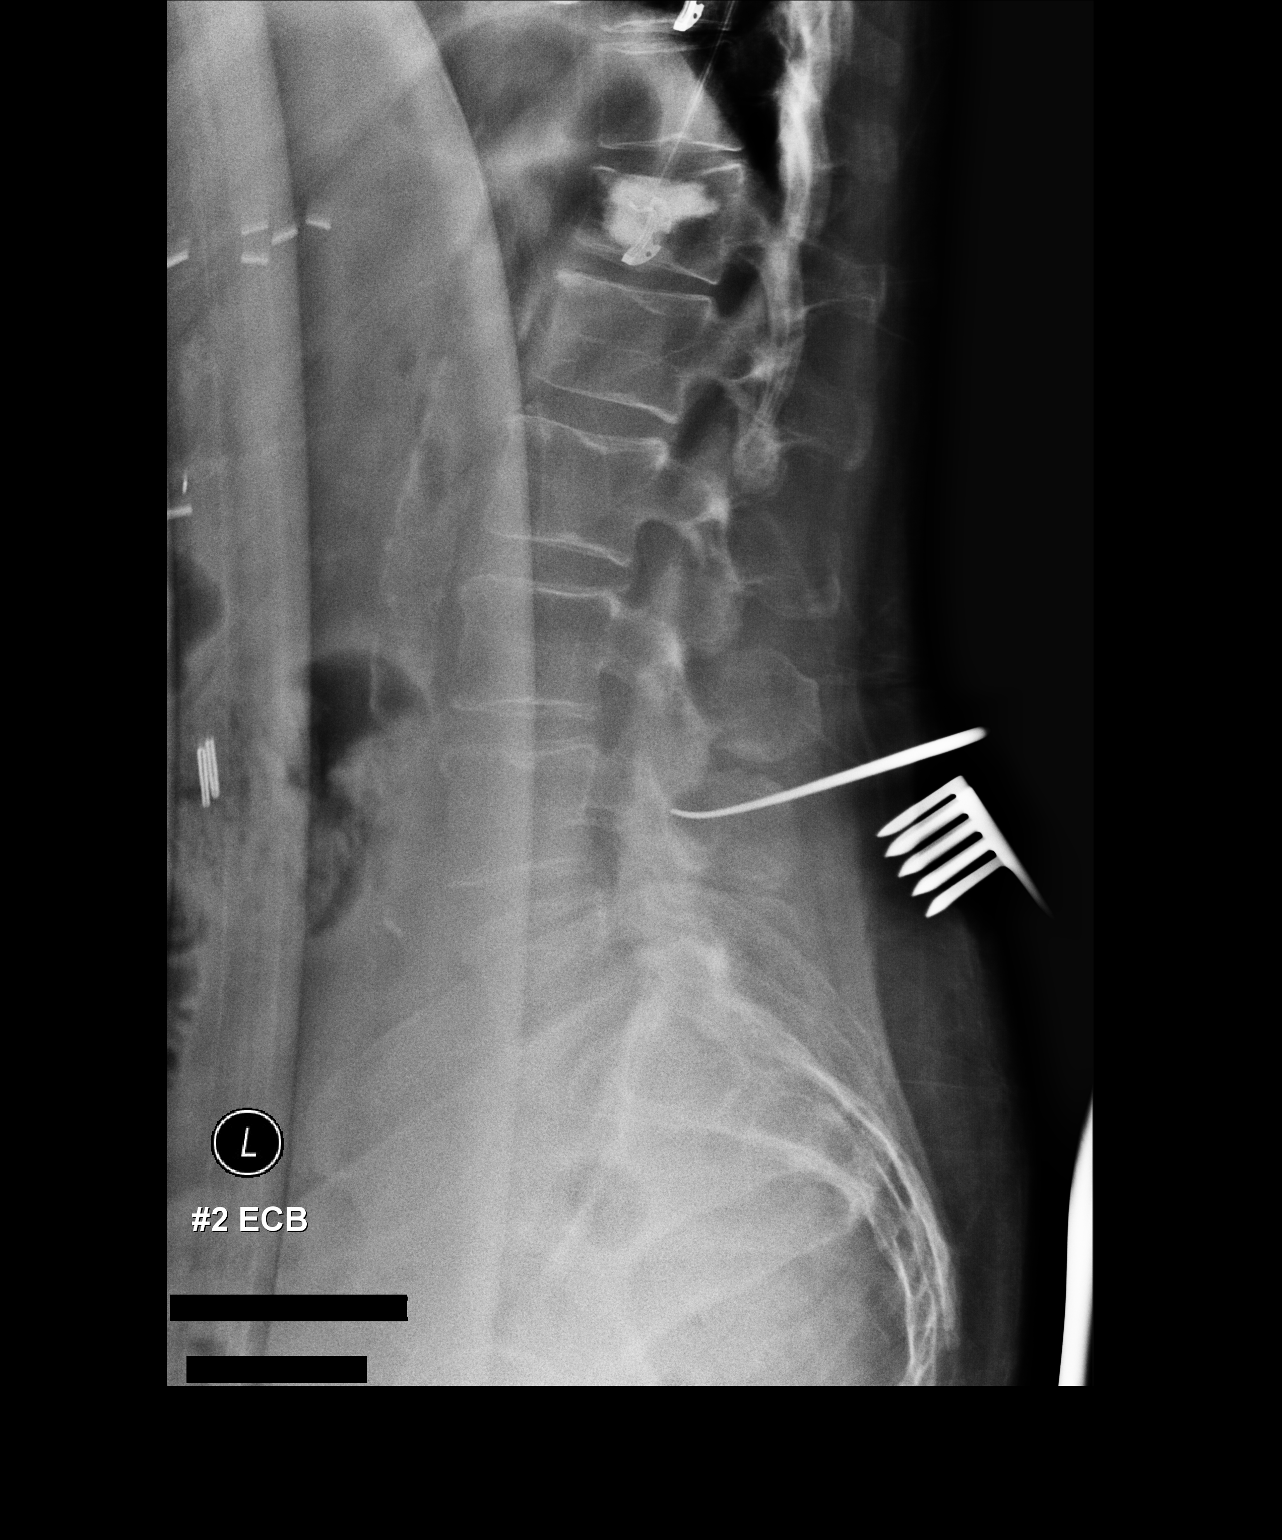

[2 of 2 positions shown; findings below may reference images not displayed]

FINDINGS: Previous cement augmentation T12. Normal alignment. Aortic
Atherosclerosis ([id number]-170.0).

First cross-table film demonstrates needle in soft tissues posterior
to the L4-5 interspace. 2nd film shows posterior metallic probe, tip
behind the L4 pedicles.
IMPRESSION: Intraoperative localization.  Intraoperative localization.

## 2021-09-08 DIAGNOSIS — E1165 Type 2 diabetes mellitus with hyperglycemia: Secondary | ICD-10-CM | POA: Diagnosis not present

## 2021-09-08 DIAGNOSIS — K2100 Gastro-esophageal reflux disease with esophagitis, without bleeding: Secondary | ICD-10-CM | POA: Diagnosis not present

## 2021-09-08 DIAGNOSIS — I10 Essential (primary) hypertension: Secondary | ICD-10-CM | POA: Diagnosis not present

## 2021-09-15 DIAGNOSIS — K580 Irritable bowel syndrome with diarrhea: Secondary | ICD-10-CM | POA: Diagnosis not present

## 2021-09-15 DIAGNOSIS — E1165 Type 2 diabetes mellitus with hyperglycemia: Secondary | ICD-10-CM | POA: Diagnosis not present

## 2021-09-15 DIAGNOSIS — E039 Hypothyroidism, unspecified: Secondary | ICD-10-CM | POA: Diagnosis not present

## 2021-09-15 DIAGNOSIS — R4582 Worries: Secondary | ICD-10-CM | POA: Diagnosis not present

## 2021-09-15 DIAGNOSIS — M797 Fibromyalgia: Secondary | ICD-10-CM | POA: Diagnosis not present

## 2021-09-15 DIAGNOSIS — I10 Essential (primary) hypertension: Secondary | ICD-10-CM | POA: Diagnosis not present

## 2021-09-15 DIAGNOSIS — M545 Low back pain, unspecified: Secondary | ICD-10-CM | POA: Diagnosis not present

## 2021-09-15 DIAGNOSIS — Z23 Encounter for immunization: Secondary | ICD-10-CM | POA: Diagnosis not present

## 2021-12-18 DIAGNOSIS — G43009 Migraine without aura, not intractable, without status migrainosus: Secondary | ICD-10-CM | POA: Diagnosis not present

## 2021-12-18 DIAGNOSIS — I10 Essential (primary) hypertension: Secondary | ICD-10-CM | POA: Diagnosis not present

## 2021-12-18 DIAGNOSIS — R42 Dizziness and giddiness: Secondary | ICD-10-CM | POA: Diagnosis not present

## 2022-01-10 DIAGNOSIS — M5416 Radiculopathy, lumbar region: Secondary | ICD-10-CM | POA: Diagnosis not present

## 2022-01-29 DIAGNOSIS — K2100 Gastro-esophageal reflux disease with esophagitis, without bleeding: Secondary | ICD-10-CM | POA: Diagnosis not present

## 2022-01-29 DIAGNOSIS — I10 Essential (primary) hypertension: Secondary | ICD-10-CM | POA: Diagnosis not present

## 2022-01-29 DIAGNOSIS — E7849 Other hyperlipidemia: Secondary | ICD-10-CM | POA: Diagnosis not present

## 2022-01-29 DIAGNOSIS — E1165 Type 2 diabetes mellitus with hyperglycemia: Secondary | ICD-10-CM | POA: Diagnosis not present

## 2022-01-29 DIAGNOSIS — E039 Hypothyroidism, unspecified: Secondary | ICD-10-CM | POA: Diagnosis not present

## 2022-02-05 DIAGNOSIS — Z0001 Encounter for general adult medical examination with abnormal findings: Secondary | ICD-10-CM | POA: Diagnosis not present

## 2022-02-05 DIAGNOSIS — J329 Chronic sinusitis, unspecified: Secondary | ICD-10-CM | POA: Diagnosis not present

## 2022-02-05 DIAGNOSIS — R059 Cough, unspecified: Secondary | ICD-10-CM | POA: Diagnosis not present

## 2022-02-05 DIAGNOSIS — E039 Hypothyroidism, unspecified: Secondary | ICD-10-CM | POA: Diagnosis not present

## 2022-02-05 DIAGNOSIS — R4582 Worries: Secondary | ICD-10-CM | POA: Diagnosis not present

## 2022-02-05 DIAGNOSIS — K580 Irritable bowel syndrome with diarrhea: Secondary | ICD-10-CM | POA: Diagnosis not present

## 2022-02-05 DIAGNOSIS — M5136 Other intervertebral disc degeneration, lumbar region: Secondary | ICD-10-CM | POA: Diagnosis not present

## 2022-02-05 DIAGNOSIS — K2100 Gastro-esophageal reflux disease with esophagitis, without bleeding: Secondary | ICD-10-CM | POA: Diagnosis not present

## 2022-02-05 DIAGNOSIS — M797 Fibromyalgia: Secondary | ICD-10-CM | POA: Diagnosis not present

## 2022-02-05 DIAGNOSIS — E1165 Type 2 diabetes mellitus with hyperglycemia: Secondary | ICD-10-CM | POA: Diagnosis not present

## 2022-02-05 DIAGNOSIS — I10 Essential (primary) hypertension: Secondary | ICD-10-CM | POA: Diagnosis not present

## 2022-03-17 DIAGNOSIS — Z1231 Encounter for screening mammogram for malignant neoplasm of breast: Secondary | ICD-10-CM | POA: Diagnosis not present

## 2022-03-31 DIAGNOSIS — H35033 Hypertensive retinopathy, bilateral: Secondary | ICD-10-CM | POA: Diagnosis not present

## 2022-05-18 DIAGNOSIS — H40013 Open angle with borderline findings, low risk, bilateral: Secondary | ICD-10-CM | POA: Diagnosis not present

## 2022-05-26 ENCOUNTER — Encounter (INDEPENDENT_AMBULATORY_CARE_PROVIDER_SITE_OTHER): Payer: Medicare Other | Admitting: Ophthalmology

## 2022-05-26 DIAGNOSIS — I10 Essential (primary) hypertension: Secondary | ICD-10-CM

## 2022-05-26 DIAGNOSIS — H43813 Vitreous degeneration, bilateral: Secondary | ICD-10-CM

## 2022-05-26 DIAGNOSIS — H35033 Hypertensive retinopathy, bilateral: Secondary | ICD-10-CM

## 2022-05-26 DIAGNOSIS — E113293 Type 2 diabetes mellitus with mild nonproliferative diabetic retinopathy without macular edema, bilateral: Secondary | ICD-10-CM

## 2022-05-26 DIAGNOSIS — H34831 Tributary (branch) retinal vein occlusion, right eye, with macular edema: Secondary | ICD-10-CM

## 2022-06-26 ENCOUNTER — Encounter (INDEPENDENT_AMBULATORY_CARE_PROVIDER_SITE_OTHER): Payer: Medicare Other | Admitting: Ophthalmology

## 2022-07-10 ENCOUNTER — Encounter (INDEPENDENT_AMBULATORY_CARE_PROVIDER_SITE_OTHER): Payer: 59 | Admitting: Ophthalmology

## 2022-07-10 DIAGNOSIS — H43813 Vitreous degeneration, bilateral: Secondary | ICD-10-CM | POA: Diagnosis not present

## 2022-07-10 DIAGNOSIS — H35033 Hypertensive retinopathy, bilateral: Secondary | ICD-10-CM

## 2022-07-10 DIAGNOSIS — I10 Essential (primary) hypertension: Secondary | ICD-10-CM

## 2022-07-10 DIAGNOSIS — E113293 Type 2 diabetes mellitus with mild nonproliferative diabetic retinopathy without macular edema, bilateral: Secondary | ICD-10-CM | POA: Diagnosis not present

## 2022-07-10 DIAGNOSIS — H34831 Tributary (branch) retinal vein occlusion, right eye, with macular edema: Secondary | ICD-10-CM | POA: Diagnosis not present

## 2022-08-03 DIAGNOSIS — H04123 Dry eye syndrome of bilateral lacrimal glands: Secondary | ICD-10-CM | POA: Diagnosis not present

## 2022-08-03 DIAGNOSIS — H35033 Hypertensive retinopathy, bilateral: Secondary | ICD-10-CM | POA: Diagnosis not present

## 2022-08-07 ENCOUNTER — Encounter (INDEPENDENT_AMBULATORY_CARE_PROVIDER_SITE_OTHER): Payer: 59 | Admitting: Ophthalmology

## 2022-08-24 DIAGNOSIS — I10 Essential (primary) hypertension: Secondary | ICD-10-CM | POA: Diagnosis not present

## 2022-09-04 ENCOUNTER — Encounter (INDEPENDENT_AMBULATORY_CARE_PROVIDER_SITE_OTHER): Payer: 59 | Admitting: Ophthalmology

## 2022-09-15 ENCOUNTER — Encounter (INDEPENDENT_AMBULATORY_CARE_PROVIDER_SITE_OTHER): Payer: 59 | Admitting: Ophthalmology

## 2022-10-27 DIAGNOSIS — E039 Hypothyroidism, unspecified: Secondary | ICD-10-CM | POA: Diagnosis not present

## 2022-10-27 DIAGNOSIS — I10 Essential (primary) hypertension: Secondary | ICD-10-CM | POA: Diagnosis not present

## 2022-10-27 DIAGNOSIS — K219 Gastro-esophageal reflux disease without esophagitis: Secondary | ICD-10-CM | POA: Diagnosis not present

## 2022-10-27 DIAGNOSIS — E7849 Other hyperlipidemia: Secondary | ICD-10-CM | POA: Diagnosis not present

## 2022-10-27 DIAGNOSIS — E119 Type 2 diabetes mellitus without complications: Secondary | ICD-10-CM | POA: Diagnosis not present

## 2022-10-30 ENCOUNTER — Encounter (INDEPENDENT_AMBULATORY_CARE_PROVIDER_SITE_OTHER): Payer: 59 | Admitting: Ophthalmology

## 2022-11-02 DIAGNOSIS — J301 Allergic rhinitis due to pollen: Secondary | ICD-10-CM | POA: Diagnosis not present

## 2022-11-02 DIAGNOSIS — E039 Hypothyroidism, unspecified: Secondary | ICD-10-CM | POA: Diagnosis not present

## 2022-11-02 DIAGNOSIS — H353290 Exudative age-related macular degeneration, unspecified eye, stage unspecified: Secondary | ICD-10-CM | POA: Diagnosis not present

## 2022-11-02 DIAGNOSIS — I10 Essential (primary) hypertension: Secondary | ICD-10-CM | POA: Diagnosis not present

## 2022-11-02 DIAGNOSIS — M545 Low back pain, unspecified: Secondary | ICD-10-CM | POA: Diagnosis not present

## 2022-11-02 DIAGNOSIS — K2100 Gastro-esophageal reflux disease with esophagitis, without bleeding: Secondary | ICD-10-CM | POA: Diagnosis not present

## 2022-11-02 DIAGNOSIS — M5136 Other intervertebral disc degeneration, lumbar region: Secondary | ICD-10-CM | POA: Diagnosis not present

## 2022-11-02 DIAGNOSIS — E1165 Type 2 diabetes mellitus with hyperglycemia: Secondary | ICD-10-CM | POA: Diagnosis not present

## 2022-11-02 DIAGNOSIS — M797 Fibromyalgia: Secondary | ICD-10-CM | POA: Diagnosis not present

## 2022-11-02 DIAGNOSIS — M810 Age-related osteoporosis without current pathological fracture: Secondary | ICD-10-CM | POA: Diagnosis not present

## 2022-11-02 DIAGNOSIS — K580 Irritable bowel syndrome with diarrhea: Secondary | ICD-10-CM | POA: Diagnosis not present

## 2023-02-19 DIAGNOSIS — E782 Mixed hyperlipidemia: Secondary | ICD-10-CM | POA: Diagnosis not present

## 2023-02-19 DIAGNOSIS — E119 Type 2 diabetes mellitus without complications: Secondary | ICD-10-CM | POA: Diagnosis not present

## 2023-02-19 DIAGNOSIS — K2100 Gastro-esophageal reflux disease with esophagitis, without bleeding: Secondary | ICD-10-CM | POA: Diagnosis not present

## 2023-02-19 DIAGNOSIS — E559 Vitamin D deficiency, unspecified: Secondary | ICD-10-CM | POA: Diagnosis not present

## 2023-02-19 DIAGNOSIS — E039 Hypothyroidism, unspecified: Secondary | ICD-10-CM | POA: Diagnosis not present

## 2023-02-19 DIAGNOSIS — E7849 Other hyperlipidemia: Secondary | ICD-10-CM | POA: Diagnosis not present

## 2023-02-25 DIAGNOSIS — E1165 Type 2 diabetes mellitus with hyperglycemia: Secondary | ICD-10-CM | POA: Diagnosis not present

## 2023-02-25 DIAGNOSIS — H353290 Exudative age-related macular degeneration, unspecified eye, stage unspecified: Secondary | ICD-10-CM | POA: Diagnosis not present

## 2023-02-25 DIAGNOSIS — Z0001 Encounter for general adult medical examination with abnormal findings: Secondary | ICD-10-CM | POA: Diagnosis not present

## 2023-02-25 DIAGNOSIS — M797 Fibromyalgia: Secondary | ICD-10-CM | POA: Diagnosis not present

## 2023-02-25 DIAGNOSIS — K2100 Gastro-esophageal reflux disease with esophagitis, without bleeding: Secondary | ICD-10-CM | POA: Diagnosis not present

## 2023-02-25 DIAGNOSIS — E039 Hypothyroidism, unspecified: Secondary | ICD-10-CM | POA: Diagnosis not present

## 2023-02-25 DIAGNOSIS — J301 Allergic rhinitis due to pollen: Secondary | ICD-10-CM | POA: Diagnosis not present

## 2023-02-25 DIAGNOSIS — Z23 Encounter for immunization: Secondary | ICD-10-CM | POA: Diagnosis not present

## 2023-02-25 DIAGNOSIS — I10 Essential (primary) hypertension: Secondary | ICD-10-CM | POA: Diagnosis not present

## 2023-02-25 DIAGNOSIS — M545 Low back pain, unspecified: Secondary | ICD-10-CM | POA: Diagnosis not present

## 2023-02-25 DIAGNOSIS — K580 Irritable bowel syndrome with diarrhea: Secondary | ICD-10-CM | POA: Diagnosis not present

## 2023-07-06 DIAGNOSIS — I10 Essential (primary) hypertension: Secondary | ICD-10-CM | POA: Diagnosis not present

## 2023-07-06 DIAGNOSIS — Z1329 Encounter for screening for other suspected endocrine disorder: Secondary | ICD-10-CM | POA: Diagnosis not present

## 2023-07-06 DIAGNOSIS — E1165 Type 2 diabetes mellitus with hyperglycemia: Secondary | ICD-10-CM | POA: Diagnosis not present

## 2023-07-06 DIAGNOSIS — E7849 Other hyperlipidemia: Secondary | ICD-10-CM | POA: Diagnosis not present

## 2023-07-06 DIAGNOSIS — Z6824 Body mass index (BMI) 24.0-24.9, adult: Secondary | ICD-10-CM | POA: Diagnosis not present

## 2023-07-06 DIAGNOSIS — R739 Hyperglycemia, unspecified: Secondary | ICD-10-CM | POA: Diagnosis not present

## 2023-07-06 DIAGNOSIS — E039 Hypothyroidism, unspecified: Secondary | ICD-10-CM | POA: Diagnosis not present

## 2023-07-06 DIAGNOSIS — G43009 Migraine without aura, not intractable, without status migrainosus: Secondary | ICD-10-CM | POA: Diagnosis not present

## 2023-09-02 DIAGNOSIS — Z1231 Encounter for screening mammogram for malignant neoplasm of breast: Secondary | ICD-10-CM | POA: Diagnosis not present

## 2023-10-28 DIAGNOSIS — E1165 Type 2 diabetes mellitus with hyperglycemia: Secondary | ICD-10-CM | POA: Diagnosis not present

## 2023-10-28 DIAGNOSIS — Z13220 Encounter for screening for lipoid disorders: Secondary | ICD-10-CM | POA: Diagnosis not present

## 2023-10-28 DIAGNOSIS — E119 Type 2 diabetes mellitus without complications: Secondary | ICD-10-CM | POA: Diagnosis not present

## 2023-11-04 DIAGNOSIS — E039 Hypothyroidism, unspecified: Secondary | ICD-10-CM | POA: Diagnosis not present

## 2023-11-04 DIAGNOSIS — K2100 Gastro-esophageal reflux disease with esophagitis, without bleeding: Secondary | ICD-10-CM | POA: Diagnosis not present

## 2023-11-04 DIAGNOSIS — Z6824 Body mass index (BMI) 24.0-24.9, adult: Secondary | ICD-10-CM | POA: Diagnosis not present

## 2023-11-04 DIAGNOSIS — I10 Essential (primary) hypertension: Secondary | ICD-10-CM | POA: Diagnosis not present

## 2024-01-30 DIAGNOSIS — S3993XA Unspecified injury of pelvis, initial encounter: Secondary | ICD-10-CM | POA: Diagnosis not present

## 2024-01-30 DIAGNOSIS — M47812 Spondylosis without myelopathy or radiculopathy, cervical region: Secondary | ICD-10-CM | POA: Diagnosis not present

## 2024-01-30 DIAGNOSIS — G8911 Acute pain due to trauma: Secondary | ICD-10-CM | POA: Diagnosis not present

## 2024-01-30 DIAGNOSIS — S299XXA Unspecified injury of thorax, initial encounter: Secondary | ICD-10-CM | POA: Diagnosis not present

## 2024-01-30 DIAGNOSIS — S3991XA Unspecified injury of abdomen, initial encounter: Secondary | ICD-10-CM | POA: Diagnosis not present

## 2024-01-30 DIAGNOSIS — S134XXA Sprain of ligaments of cervical spine, initial encounter: Secondary | ICD-10-CM | POA: Diagnosis not present

## 2024-01-30 DIAGNOSIS — M5030 Other cervical disc degeneration, unspecified cervical region: Secondary | ICD-10-CM | POA: Diagnosis not present

## 2024-01-30 DIAGNOSIS — K449 Diaphragmatic hernia without obstruction or gangrene: Secondary | ICD-10-CM | POA: Diagnosis not present

## 2024-01-30 DIAGNOSIS — S199XXA Unspecified injury of neck, initial encounter: Secondary | ICD-10-CM | POA: Diagnosis not present

## 2024-01-30 DIAGNOSIS — S0990XA Unspecified injury of head, initial encounter: Secondary | ICD-10-CM | POA: Diagnosis not present

## 2024-01-30 DIAGNOSIS — I6782 Cerebral ischemia: Secondary | ICD-10-CM | POA: Diagnosis not present

## 2024-02-07 DIAGNOSIS — Z6824 Body mass index (BMI) 24.0-24.9, adult: Secondary | ICD-10-CM | POA: Diagnosis not present

## 2024-02-07 DIAGNOSIS — M542 Cervicalgia: Secondary | ICD-10-CM | POA: Diagnosis not present

## 2024-02-28 DIAGNOSIS — E039 Hypothyroidism, unspecified: Secondary | ICD-10-CM | POA: Diagnosis not present

## 2024-02-28 DIAGNOSIS — K2100 Gastro-esophageal reflux disease with esophagitis, without bleeding: Secondary | ICD-10-CM | POA: Diagnosis not present

## 2024-02-28 DIAGNOSIS — E1165 Type 2 diabetes mellitus with hyperglycemia: Secondary | ICD-10-CM | POA: Diagnosis not present

## 2024-02-28 DIAGNOSIS — Z0001 Encounter for general adult medical examination with abnormal findings: Secondary | ICD-10-CM | POA: Diagnosis not present

## 2024-02-28 DIAGNOSIS — I10 Essential (primary) hypertension: Secondary | ICD-10-CM | POA: Diagnosis not present

## 2024-02-28 DIAGNOSIS — Z13220 Encounter for screening for lipoid disorders: Secondary | ICD-10-CM | POA: Diagnosis not present

## 2024-02-28 DIAGNOSIS — E559 Vitamin D deficiency, unspecified: Secondary | ICD-10-CM | POA: Diagnosis not present

## 2024-02-28 DIAGNOSIS — Z6824 Body mass index (BMI) 24.0-24.9, adult: Secondary | ICD-10-CM | POA: Diagnosis not present

## 2024-02-28 DIAGNOSIS — Z23 Encounter for immunization: Secondary | ICD-10-CM | POA: Diagnosis not present

## 2024-02-28 DIAGNOSIS — Z1389 Encounter for screening for other disorder: Secondary | ICD-10-CM | POA: Diagnosis not present
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
## 2024-03-16 ENCOUNTER — Ambulatory Visit: Admitting: Physical Therapy

## 2024-03-16 ENCOUNTER — Encounter: Admitting: Physical Therapy

## 2024-03-16 ENCOUNTER — Ambulatory Visit: Attending: Physical Therapy | Admitting: Physical Therapy

## 2024-05-08 NOTE — Progress Notes (Signed)
 Erika Ward                                          MRN: [id number]   05/08/2024   The [facility] Quality Team Specialist reviewed this patient medical record for the purposes of chart review for care gap closure. The following were reviewed: chart review for care gap closure-kidney health evaluation for diabetes:eGFR  and uACR.    [facility] Quality Team

## 2024-05-17 ENCOUNTER — Ambulatory Visit: Attending: Physical Therapy | Admitting: Physical Therapy
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
